# Patient Record
Sex: Female | Born: 1985 | Race: White | Hispanic: No | Marital: Married | Smoking: Never smoker
Health system: Southern US, Community
[De-identification: ages and names within clinical notes are randomized; demographics above are authoritative.]

## PROBLEM LIST (undated history)

## (undated) ENCOUNTER — Inpatient Hospital Stay (HOSPITAL_COMMUNITY): Payer: Self-pay

## (undated) DIAGNOSIS — Z87440 Personal history of urinary (tract) infections: Secondary | ICD-10-CM

## (undated) DIAGNOSIS — J45909 Unspecified asthma, uncomplicated: Secondary | ICD-10-CM

## (undated) DIAGNOSIS — G43909 Migraine, unspecified, not intractable, without status migrainosus: Secondary | ICD-10-CM

## (undated) DIAGNOSIS — B019 Varicella without complication: Secondary | ICD-10-CM

## (undated) HISTORY — DX: Migraine, unspecified, not intractable, without status migrainosus: G43.909

## (undated) HISTORY — PX: NO PAST SURGERIES: SHX2092

## (undated) HISTORY — DX: Personal history of urinary (tract) infections: Z87.440

## (undated) HISTORY — DX: Unspecified asthma, uncomplicated: J45.909

## (undated) HISTORY — DX: Varicella without complication: B01.9

---

## 2013-07-14 ENCOUNTER — Emergency Department: Payer: Self-pay | Admitting: Emergency Medicine

## 2013-07-14 LAB — URINALYSIS, COMPLETE
BILIRUBIN, UR: NEGATIVE
Blood: NEGATIVE
Glucose,UR: NEGATIVE mg/dL (ref 0–75)
Nitrite: NEGATIVE
PH: 5 (ref 4.5–8.0)
Protein: NEGATIVE
RBC,UR: 2 /HPF (ref 0–5)
Specific Gravity: 1.026 (ref 1.003–1.030)
WBC UR: 43 /HPF (ref 0–5)

## 2013-07-14 LAB — COMPREHENSIVE METABOLIC PANEL
ALK PHOS: 35 U/L — AB
AST: 14 U/L — AB (ref 15–37)
Albumin: 3.8 g/dL (ref 3.4–5.0)
Anion Gap: 8 (ref 7–16)
BUN: 14 mg/dL (ref 7–18)
Bilirubin,Total: 0.4 mg/dL (ref 0.2–1.0)
CALCIUM: 8.9 mg/dL (ref 8.5–10.1)
Chloride: 105 mmol/L (ref 98–107)
Co2: 26 mmol/L (ref 21–32)
Creatinine: 0.66 mg/dL (ref 0.60–1.30)
EGFR (Non-African Amer.): >60
GLUCOSE: 94 mg/dL (ref 65–99)
OSMOLALITY: 278 (ref 275–301)
Potassium: 3.4 mmol/L — ABNORMAL LOW (ref 3.5–5.1)
SGPT (ALT): 17 U/L (ref 12–78)
SODIUM: 139 mmol/L (ref 136–145)
Total Protein: 7 g/dL (ref 6.4–8.2)

## 2013-07-14 LAB — CBC WITH DIFFERENTIAL/PLATELET
BASOS ABS: 0.1 10*3/uL (ref 0.0–0.1)
Basophil %: 0.7 %
EOS ABS: 0.2 10*3/uL (ref 0.0–0.7)
Eosinophil %: 2.3 %
HCT: 40.3 % (ref 35.0–47.0)
HGB: 13.5 g/dL (ref 12.0–16.0)
LYMPHS PCT: 26.5 %
Lymphocyte #: 2 10*3/uL (ref 1.0–3.6)
MCH: 29.6 pg (ref 26.0–34.0)
MCHC: 33.5 g/dL (ref 32.0–36.0)
MCV: 88 fL (ref 80–100)
MONOS PCT: 5.8 %
Monocyte #: 0.4 x10 3/mm (ref 0.2–0.9)
NEUTROS ABS: 4.9 10*3/uL (ref 1.4–6.5)
Neutrophil %: 64.7 %
Platelet: 222 10*3/uL (ref 150–440)
RBC: 4.56 10*6/uL (ref 3.80–5.20)
RDW: 13.1 % (ref 11.5–14.5)
WBC: 7.5 10*3/uL (ref 3.6–11.0)

## 2013-07-14 LAB — HCG, QUANTITATIVE, PREGNANCY: BETA HCG, QUANT.: 111015 m[IU]/mL — AB

## 2013-07-14 LAB — LIPASE, BLOOD: Lipase: 190 U/L (ref 73–393)

## 2013-10-02 ENCOUNTER — Encounter: Payer: Self-pay | Admitting: Obstetrics and Gynecology

## 2013-10-21 ENCOUNTER — Encounter: Payer: Self-pay | Admitting: Pediatric Cardiology

## 2013-10-21 DIAGNOSIS — O358XX0 Maternal care for other (suspected) fetal abnormality and damage, not applicable or unspecified: Secondary | ICD-10-CM | POA: Insufficient documentation

## 2014-02-22 ENCOUNTER — Inpatient Hospital Stay: Payer: Self-pay

## 2014-02-22 LAB — CBC WITH DIFFERENTIAL/PLATELET
BASOS PCT: 0.4 %
Basophil #: 0 10*3/uL (ref 0.0–0.1)
EOS PCT: 1 %
Eosinophil #: 0.1 10*3/uL (ref 0.0–0.7)
HCT: 39.6 % (ref 35.0–47.0)
HGB: 13.2 g/dL (ref 12.0–16.0)
LYMPHS ABS: 2 10*3/uL (ref 1.0–3.6)
Lymphocyte %: 15.7 %
MCH: 28.4 pg (ref 26.0–34.0)
MCHC: 33.4 g/dL (ref 32.0–36.0)
MCV: 85 fL (ref 80–100)
Monocyte #: 0.8 x10 3/mm (ref 0.2–0.9)
Monocyte %: 6.1 %
NEUTROS PCT: 76.8 %
Neutrophil #: 9.7 10*3/uL — ABNORMAL HIGH (ref 1.4–6.5)
Platelet: 247 10*3/uL (ref 150–440)
RBC: 4.66 10*6/uL (ref 3.80–5.20)
RDW: 13.1 % (ref 11.5–14.5)
WBC: 12.7 10*3/uL — AB (ref 3.6–11.0)

## 2014-02-23 LAB — HEMATOCRIT: HCT: 35.4 % (ref 35.0–47.0)

## 2014-06-23 NOTE — H&P (Signed)
L&D Evaluation:  History:  HPI 29yo MWF presents in active labor at term, G1P0000 with [facility] 03/02/14, normal PNC to date   Presents with back pain, contractions   Patient's Medical History No Chronic Illness   Patient's Surgical History none   Medications Pre Natal Vitamins  Tylenol (Acetaminophen)   Allergies NKDA   Social History none   Family History Non-Contributory   ROS:  ROS All systems were reviewed.  HEENT, CNS, GI, GU, Respiratory, CV, Renal and Musculoskeletal systems were found to be normal.   Exam:  Vital Signs stable   General no apparent distress   Mental Status clear   Abdomen gravid, tender with contractions   Estimated Fetal Weight Average for gestational age   Fetal Position vtx   Back no CVAT   Edema no edema   Pelvic 90/90/0   Mebranes Ruptured, AROM   Description clear   FHT normal rate with no decels   Fetal Heart Rate 113   Ucx irregular   Ucx Frequency 3 min   Length of each Contraction 50 seconds   Ucx Pain Scale 0 as epidural in place   Skin dry   Impression:  Impression active labor   Plan:  Plan monitor contractions and for cervical change   Electronic Signatures: Evonnie Pat (CNM)  (Signed 10-Jan-16 08:14)  Authored: L&D Evaluation   Last Updated: 10-Jan-16 08:14 by Evonnie Pat (CNM)

## 2015-02-14 NOTE — L&D Delivery Note (Addendum)
  Bree, Rees [id number]  Delivery Note At 9:02 AM a viable and healthy female was delivered via Vaginal, Spontaneous Delivery (Presentation: LOA;  ).  APGAR: 8, 9; weight  pending.   Placenta status: spontaneous, intact.  Cord:  with the following complications: none.  Cord pH: na  Anesthesia:   Episiotomy: None Lacerations: None Suture Repair: na Est. Blood Loss (mL): 100  Mom to postpartum.  Baby to Couplet care / Skin to Skin.  Orlander Norwood J 01/08/2016, 9:39 AM     Lanasia, Pytlik [id number]  Delivery Note At 9:06 AM a viable and healthy female was delivered via Vaginal, Vacuum (Extractor). Kiwi x one pull. 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 Applied at +4. One pull , no pop offs. (Presentation: LOA  ).  APGAR: 8, 9; weight  pending.   Placenta status: spontanous, intact.  Cord:  with the following complications: occult cord prolapse.  Cord pH: na  Anesthesia:  epidural Episiotomy: None Lacerations: None Suture Repair: na Est. Blood Loss (mL): 100  Mom to na.  Baby to Couplet care / Skin to Skin.  Shaelee Forni J 01/08/2016, 9:39 AM  Dr. Pamala Hurry present for ultrasound guidance and delivery for 30 minutes.

## 2015-02-17 ENCOUNTER — Encounter: Payer: Self-pay | Admitting: Family Medicine

## 2015-02-17 ENCOUNTER — Ambulatory Visit (INDEPENDENT_AMBULATORY_CARE_PROVIDER_SITE_OTHER): Payer: Commercial Managed Care - HMO | Admitting: Family Medicine

## 2015-02-17 VITALS — BP 118/88 | HR 76 | Temp 98.0°F | Ht 64.5 in | Wt 116.0 lb

## 2015-02-17 DIAGNOSIS — J029 Acute pharyngitis, unspecified: Secondary | ICD-10-CM | POA: Insufficient documentation

## 2015-02-17 DIAGNOSIS — G43909 Migraine, unspecified, not intractable, without status migrainosus: Secondary | ICD-10-CM | POA: Insufficient documentation

## 2015-02-17 DIAGNOSIS — R55 Syncope and collapse: Secondary | ICD-10-CM | POA: Diagnosis not present

## 2015-02-17 DIAGNOSIS — G43709 Chronic migraine without aura, not intractable, without status migrainosus: Secondary | ICD-10-CM

## 2015-02-17 LAB — COMPREHENSIVE METABOLIC PANEL
ALBUMIN: 4.6 g/dL (ref 3.5–5.2)
ALT: 15 U/L (ref 0–35)
AST: 18 U/L (ref 0–37)
Alkaline Phosphatase: 48 U/L (ref 39–117)
BUN: 16 mg/dL (ref 6–23)
CO2: 30 mEq/L (ref 19–32)
CREATININE: 0.66 mg/dL (ref 0.40–1.20)
Calcium: 9.6 mg/dL (ref 8.4–10.5)
Chloride: 103 mEq/L (ref 96–112)
GFR: 113.91 mL/min (ref >60.00)
Glucose, Bld: 93 mg/dL (ref 70–99)
Potassium: 3.8 mEq/L (ref 3.5–5.1)
Sodium: 139 mEq/L (ref 135–145)
Total Bilirubin: 0.3 mg/dL (ref 0.2–1.2)
Total Protein: 7.5 g/dL (ref 6.0–8.3)

## 2015-02-17 LAB — POCT RAPID STREP A (OFFICE): RAPID STREP A SCREEN: NEGATIVE

## 2015-02-17 LAB — CBC
HCT: 43.7 % (ref 36.0–46.0)
Hemoglobin: 14.4 g/dL (ref 12.0–15.0)
MCHC: 32.9 g/dL (ref 30.0–36.0)
MCV: 89.3 fl (ref 78.0–100.0)
Platelets: 225 10*3/uL (ref 150.0–400.0)
RBC: 4.89 Mil/uL (ref 3.87–5.11)
RDW: 13.2 % (ref 11.5–15.5)
WBC: 9 10*3/uL (ref 4.0–10.5)

## 2015-02-17 LAB — TSH: TSH: 3.63 u[IU]/mL (ref 0.35–4.50)

## 2015-02-17 LAB — IRON AND TIBC
%SAT: 7 % — ABNORMAL LOW (ref 11–50)
IRON: 20 ug/dL — AB (ref 40–190)
TIBC: 295 ug/dL (ref 250–450)
UIBC: 275 ug/dL (ref 125–400)

## 2015-02-17 LAB — IRON: IRON: 25 ug/dL — AB (ref 42–145)

## 2015-02-17 LAB — POCT URINE PREGNANCY: PREG TEST UR: NEGATIVE

## 2015-02-17 LAB — FERRITIN: Ferritin: 50.2 ng/mL (ref 10.0–291.0)

## 2015-02-17 NOTE — Progress Notes (Signed)
Pre visit review using our clinic review tool, if applicable. No additional management support is needed unless otherwise documented below in the visit note. 

## 2015-02-17 NOTE — Progress Notes (Addendum)
Subjective:  Patient ID: Latasha Smith, female    DOB: [birth year]  Age: 30 y.o. MRN: [id number]  CC: Establish care; Sore throat, Dizziness/lightheadedness, feeling that she is going to pass out  HPI Latasha Smith is a 30 y.o. female presents to the clinic today with the following concerns/issues.  Sore throat  Began 2 days ago.  She reports associated low-grade temperature, 100.2.  No known sick contacts.  She's been taking ibuprofen with some relief.  No known exacerbating factors.  Migraine  Patient reports that she has a history of migraine headache.  She states that for the past month she been experiencing these headaches more frequently.  She states that they occur approximately once a day.  She states the pain is located behind the eyes.  She states that she takes ibuprofen with improvement.  She is concerned given the increase in frequency.  No known inciting or exacerbating factors.  Dizziness/lightheadedness  Patient reports that for the past month she's been experiencing dizziness/lightheadedness.  She states that it occurs with certain movements, especially when rising from bending.  She states it also occurs when she lifts objects.  Additionally she states that it has happened with exercise.  She describes it as feeling that she cannot pass out. She states that her vision gets blurry and goes dark.  She denies any associated chest pain or shortness of breath. She denies vertigo.  She does report associated weakness and fatigue. Additionally, she states that she's had some nausea.  No reports of palpitations. She has not actually passed out.  He states that her symptoms last from minutes up to 1 hour.  Improves with rest.  PMH, Surgical Hx, Family Hx, Social History reviewed and updated as below.  Past Medical History  Diagnosis Date  . Asthma     As a child  . Chicken pox   . Migraine   . History of UTI    Past  Surgical History  Procedure Laterality Date  . No past surgeries     Family History  Problem Relation Age of Onset  . Alcoholism Paternal Grandfather   . Arthritis Maternal Grandmother   . Lung cancer Paternal Grandfather   . Heart disease Maternal Grandfather   . Stroke Paternal Grandmother   . Hypertension Maternal Grandfather   . Diabetes Maternal Grandfather    Social History  Substance Use Topics  . Smoking status: Never Smoker   . Smokeless tobacco: Never Used  . Alcohol Use: No    Review of Systems  HENT: Positive for sore throat and voice change.   Eyes: Positive for visual disturbance.  Respiratory: Positive for cough.   Gastrointestinal: Positive for nausea.  Neurological: Positive for dizziness, weakness, light-headedness and headaches.  Psychiatric/Behavioral:       Anxiety.  All other systems reviewed and are negative.   Objective:   Today's Vitals: BP 118/88 mmHg  Pulse 76  Temp(Src) 98 F (36.7 C) (Oral)  Ht 5' 4.5" (1.638 m)  Wt 116 lb (52.617 kg)  BMI 19.61 kg/m2  SpO2 98%  LMP 01/17/2015  Physical Exam  Constitutional: She is oriented to person, place, and time. She appears well-developed. No distress.  HENT:  Head: Normocephalic and atraumatic.  Oropharynx clear. TMs normal bilaterally.   Eyes: Conjunctivae are normal. No scleral icterus.  Neck: Neck supple.  Cardiovascular: Normal rate and regular rhythm.   2/6 systolic murmur noted.  Pulmonary/Chest: Effort normal and breath sounds normal. No respiratory distress. She has  no wheezes. She has no rales.  Abdominal: Soft. She exhibits no distension. There is no tenderness. There is no rebound and no guarding.  Musculoskeletal: Normal range of motion. She exhibits no edema.  Lymphadenopathy:    She has no cervical adenopathy.  Neurological: She is alert and oriented to person, place, and time.  No focal deficits.  Skin: Skin is warm and dry.  Psychiatric: She has a normal mood and  affect.  Vitals reviewed.  Assessment & Plan:   Problem List Items Addressed This Visit    Sore throat - Primary    Rapid strep negative today. Sending for culture. No indication for treatment at this time. Continue ibuprofen as needed for pain.      Relevant Orders   POCT rapid strep A (Completed)   Culture, Group A Strep   Pre-syncope    Patient's history consistent with presyncope. Orthostatics positive today with a 20 mm Hg drop in systolic blood pressure from lying to standing. Given history of heavy menses and anemia (during pregnancy and postpartum), CBC and iron panel were obtained. CBC normal. Iron Panel was significant for iron deficiency. Will treat with iron supplementation and prenatal. Patient does not have any associated chest pain or shortness of breath. The etiology of her presyncope is unclear to me at this time. Will discuss with patient and likely refer to cardiology for further evaluation with echo, potential Holter monitor, and potential tilt table test.        Relevant Orders   CBC (Completed)   Comp Met (CMET) (Completed)   TSH (Completed)   Ferritin (Completed)   Iron (Completed)   Iron Binding Cap (TIBC) (Completed)   POCT urine pregnancy (Completed)   Migraine    Chronic. Advised continued intermittent use of NSAIDs. If continues to be persistent will consider prophylaxis.        Follow-up: Follow up to be scheduled following work up.  [place]

## 2015-02-17 NOTE — Assessment & Plan Note (Signed)
Chronic. Advised continued intermittent use of NSAIDs. If continues to be persistent will consider prophylaxis.

## 2015-02-17 NOTE — Assessment & Plan Note (Signed)
Rapid strep negative today. Sending for culture. No indication for treatment at this time. Continue ibuprofen as needed for pain.

## 2015-02-17 NOTE — Patient Instructions (Signed)
It was nice to see you today.  Continue the ibuprofen for your sore throat.  If you migraines continue, we can consider prophylactic treatment (to prevent them).  We will call with your lab results.  In the mean time, take it easy.  Take care. Follow up to be arranged following your labs.  Dr

## 2015-02-17 NOTE — Assessment & Plan Note (Addendum)
Patient's history consistent with presyncope. Orthostatics positive today with a 20 mm Hg drop in systolic blood pressure from lying to standing. Given history of heavy menses and anemia (during pregnancy and postpartum), CBC and iron panel were obtained. CBC normal. Iron Panel was significant for iron deficiency. Will treat with iron supplementation and prenatal. Patient does not have any associated chest pain or shortness of breath. The etiology of her presyncope is unclear to me at this time. Will discuss with patient and likely refer to cardiology for further evaluation with echo, potential Holter monitor, and potential tilt table test.

## 2015-02-18 ENCOUNTER — Other Ambulatory Visit: Payer: Self-pay | Admitting: Family Medicine

## 2015-02-18 ENCOUNTER — Telehealth: Payer: Self-pay | Admitting: *Deleted

## 2015-02-18 MED ORDER — FERROUS SULFATE 325 (65 FE) MG PO TABS: 325.0000 mg | ORAL_TABLET | Freq: Two times a day (BID) | ORAL | Status: DC

## 2015-02-18 NOTE — Telephone Encounter (Signed)
Patient missed a call, she state that it could of been about her lab results from 02/17/15.

## 2015-02-18 NOTE — Telephone Encounter (Signed)
Tried to return call to patient no answer left voicemail.

## 2015-02-19 ENCOUNTER — Other Ambulatory Visit: Payer: Self-pay | Admitting: Family Medicine

## 2015-02-19 DIAGNOSIS — R55 Syncope and collapse: Secondary | ICD-10-CM

## 2015-02-19 LAB — CULTURE, GROUP A STREP: Organism ID, Bacteria: NORMAL

## 2015-02-19 NOTE — Telephone Encounter (Signed)
Patient stated that she will be at telephone [phone] for today, to receive test results.

## 2015-02-19 NOTE — Telephone Encounter (Signed)
Spoke with patient, reviewed labs will follow up with cardiology as requested.  Would like referral to cardiology in [place].  Thanks

## 2015-02-23 ENCOUNTER — Ambulatory Visit: Payer: Self-pay | Admitting: Primary Care

## 2015-03-01 ENCOUNTER — Encounter: Payer: Self-pay | Admitting: *Deleted

## 2015-03-08 ENCOUNTER — Ambulatory Visit (INDEPENDENT_AMBULATORY_CARE_PROVIDER_SITE_OTHER): Payer: Commercial Managed Care - HMO | Admitting: Primary Care

## 2015-03-08 ENCOUNTER — Encounter: Payer: Self-pay | Admitting: Primary Care

## 2015-03-08 VITALS — BP 116/70 | HR 81 | Temp 98.1°F | Ht 65.0 in | Wt 123.0 lb

## 2015-03-08 DIAGNOSIS — R21 Rash and other nonspecific skin eruption: Secondary | ICD-10-CM

## 2015-03-08 MED ORDER — TRIAMCINOLONE ACETONIDE 0.1 % EX CREA: 1.0000 "application " | TOPICAL_CREAM | Freq: Two times a day (BID) | CUTANEOUS | Status: DC

## 2015-03-08 NOTE — Progress Notes (Signed)
   Subjective:    Patient ID: Latasha Smith, female    DOB: [birth year], 30 y.o.   MRN: [id number]  HPI  Latasha Smith is a 30 year old female who presents today with a chief complaint of rash. She first noticed the rash 1 month ago.  Her rash is located to her knees and bilateral axilla. Over the past week she's noticed increased itching and feels as though the rash is enlarging. She's applied OTC cortisone cream without improvement. Denies closure of throat, wheezing, changes in soaps/detergents/food/daily routine. Endorses a history of eczema.    Review of Systems  Constitutional: Negative for fever and chills.  HENT: Negative for trouble swallowing.   Skin: Positive for rash.       Past Medical History  Diagnosis Date  . Asthma     As a child  . Chicken pox   . Migraine   . History of UTI     Social History   Social History  . Marital Status: Married    Spouse Name: N/A  . Number of Children: N/A  . Years of Education: N/A   Occupational History  . Not on file.   Social History Main Topics  . Smoking status: Never Smoker   . Smokeless tobacco: Never Used  . Alcohol Use: No  . Drug Use: No  . Sexual Activity: Yes    Birth Control/ Protection: Condom   Other Topics Concern  . Not on file   Social History Narrative    Past Surgical History  Procedure Laterality Date  . No past surgeries      Family History  Problem Relation Age of Onset  . Alcoholism Paternal Grandfather   . Arthritis Maternal Grandmother   . Lung cancer Paternal Grandfather   . Heart disease Maternal Grandfather   . Stroke Paternal Grandmother   . Hypertension Maternal Grandfather   . Diabetes Maternal Grandfather     No Known Allergies  Current Outpatient Prescriptions on File Prior to Visit  Medication Sig Dispense Refill  . ferrous sulfate 325 (65 FE) MG tablet Take 1 tablet (325 mg total) by mouth 2 (two) times daily. 60 tablet 2   No current facility-administered  medications on file prior to visit.    BP 116/70 mmHg  Pulse 81  Temp(Src) 98.1 F (36.7 C) (Oral)  Ht 5\' 5"  (1.651 m)  Wt 123 lb (55.792 kg)  BMI 20.47 kg/m2  SpO2 98%  LMP 02/15/2015    Objective:   Physical Exam  Constitutional: She appears well-nourished.  Cardiovascular: Normal rate and regular rhythm.   Pulmonary/Chest: Effort normal and breath sounds normal.  Skin:  Dry, whitish, rash to bilateral knees characteristic of eczema.          Assessment & Plan:  Rash:  Located to bilateral patella x 1 month, worse over last week. Dry, whitish, rash representative of eczema. Does not appear to be contact dermatitis. No changes in soaps or daily routine. No improvement in itching or size with OTC hydrocortisone. RX for Triamcinolone 0.1% sent to pharmacy for BID application. Instructed to wash hands after use and not to apply to face. She is to follow up with PCP if no improvement.

## 2015-03-08 NOTE — Patient Instructions (Signed)
Start Triamcinolone cream. Apply twice daily to affected areas for 7 days. Please notify myself or PCP if no improvement in rash.  It was a pleasure meeting you!

## 2015-03-08 NOTE — Progress Notes (Signed)
Pre visit review using our clinic review tool, if applicable. No additional management support is needed unless otherwise documented below in the visit note. 

## 2015-06-23 ENCOUNTER — Encounter (HOSPITAL_COMMUNITY): Payer: Self-pay | Admitting: *Deleted

## 2015-06-23 ENCOUNTER — Inpatient Hospital Stay (HOSPITAL_COMMUNITY)
Admission: AD | Admit: 2015-06-23 | Discharge: 2015-06-23 | Disposition: A | Discharge status: Home / Self Care | Payer: Commercial Managed Care - HMO | Source: Ambulatory Visit | Attending: Obstetrics and Gynecology | Admitting: Obstetrics and Gynecology

## 2015-06-23 DIAGNOSIS — O219 Vomiting of pregnancy, unspecified: Secondary | ICD-10-CM

## 2015-06-23 DIAGNOSIS — Z3A01 Less than 8 weeks gestation of pregnancy: Secondary | ICD-10-CM | POA: Diagnosis not present

## 2015-06-23 DIAGNOSIS — O99611 Diseases of the digestive system complicating pregnancy, first trimester: Secondary | ICD-10-CM | POA: Insufficient documentation

## 2015-06-23 DIAGNOSIS — O210 Mild hyperemesis gravidarum: Secondary | ICD-10-CM | POA: Insufficient documentation

## 2015-06-23 DIAGNOSIS — K117 Disturbances of salivary secretion: Secondary | ICD-10-CM | POA: Diagnosis not present

## 2015-06-23 LAB — URINE MICROSCOPIC-ADD ON

## 2015-06-23 LAB — URINALYSIS, ROUTINE W REFLEX MICROSCOPIC
Bilirubin Urine: NEGATIVE
Glucose, UA: NEGATIVE mg/dL
HGB URINE DIPSTICK: NEGATIVE
Ketones, ur: NEGATIVE mg/dL
NITRITE: NEGATIVE
PROTEIN: NEGATIVE mg/dL
SPECIFIC GRAVITY, URINE: 1.025 (ref 1.005–1.030)
pH: 6 (ref 5.0–8.0)

## 2015-06-23 LAB — POCT PREGNANCY, URINE: PREG TEST UR: POSITIVE — AB

## 2015-06-23 MED ORDER — FAMOTIDINE IN NACL 20-0.9 MG/50ML-% IV SOLN
20.0000 mg | Freq: Once | INTRAVENOUS | Status: AC
  Administered 2015-06-23: 20 mg via INTRAVENOUS
  Filled 2015-06-23: qty 50

## 2015-06-23 MED ORDER — PROMETHAZINE HCL 12.5 MG RE SUPP: 12.5000 mg | Freq: Four times a day (QID) | RECTAL | Status: DC | PRN

## 2015-06-23 MED ORDER — PROMETHAZINE HCL 25 MG/ML IJ SOLN
25.0000 mg | Freq: Once | INTRAVENOUS | Status: AC
  Administered 2015-06-23: 25 mg via INTRAVENOUS
  Filled 2015-06-23: qty 1

## 2015-06-23 MED ORDER — RANITIDINE HCL 150 MG PO CAPS: 150.0000 mg | ORAL_CAPSULE | Freq: Every day | ORAL | Status: DC

## 2015-06-23 MED ORDER — DOXYLAMINE-PYRIDOXINE 10-10 MG PO TBEC: 2.0000 | DELAYED_RELEASE_TABLET | Freq: Every day | ORAL | Status: DC

## 2015-06-23 NOTE — MAU Note (Signed)
Pt reports she is [redacted] weeks pregnant with twins, states she has been unable to keep any liquids or solids down for the last 3 days.

## 2015-06-23 NOTE — MAU Provider Note (Signed)
History     CSN: [id number]  Arrival date and time: 06/23/15 [id number]   First Provider Initiated Contact with Patient 06/23/15 2045      Chief Complaint  Patient presents with  . Nausea  . Dehydration  . Emesis   HPI   Latasha Smith is a 30 y.o. female G2P1001 @ [redacted]w[redacted]d presents to MAU with N/V.  Patient is scheduled with [facility], however has not been seen by their practice.   Not taking anything currently, wants to take something however is unable to keep anything down.  Has not actually vomited today, however every time she puts fluids in her mouth it comes back up.   + spitting.   OB History    Gravida Para Term Preterm AB TAB SAB Ectopic Multiple Living   2 1 1  0 0 0 0 0 0 1      Past Medical History  Diagnosis Date  . Asthma     As a child  . Chicken pox   . Migraine   . History of UTI     Past Surgical History  Procedure Laterality Date  . No past surgeries      Family History  Problem Relation Age of Onset  . Alcoholism Paternal Grandfather   . Arthritis Maternal Grandmother   . Lung cancer Paternal Grandfather   . Heart disease Maternal Grandfather   . Stroke Paternal Grandmother   . Hypertension Maternal Grandfather   . Diabetes Maternal Grandfather     Social History  Substance Use Topics  . Smoking status: Never Smoker   . Smokeless tobacco: Never Used  . Alcohol Use: No    Allergies: No Known Allergies  Prescriptions prior to admission  Medication Sig Dispense Refill Last Dose  . ferrous sulfate 325 (65 FE) MG tablet Take 1 tablet (325 mg total) by mouth 2 (two) times daily. (Patient not taking: Reported on 06/23/2015) 60 tablet 2 Taking  . triamcinolone cream (KENALOG) 0.1 % Apply 1 application topically 2 (two) times daily. (Patient not taking: Reported on 06/23/2015) 30 g 0    Results for orders placed or performed during the hospital encounter of 06/23/15 (from the past 48 hour(s))  Urinalysis, Routine w reflex microscopic  (not at [facility])     Status: Abnormal   Collection Time: 06/23/15  8:17 PM  Result Value Ref Range   Color, Urine YELLOW YELLOW   APPearance CLEAR CLEAR   Specific Gravity, Urine 1.025 1.005 - 1.030   pH 6.0 5.0 - 8.0   Glucose, UA NEGATIVE NEGATIVE mg/dL   Hgb urine dipstick NEGATIVE NEGATIVE   Bilirubin Urine NEGATIVE NEGATIVE   Ketones, ur NEGATIVE NEGATIVE mg/dL   Protein, ur NEGATIVE NEGATIVE mg/dL   Nitrite NEGATIVE NEGATIVE   Leukocytes, UA TRACE (A) NEGATIVE  Urine microscopic-add on     Status: Abnormal   Collection Time: 06/23/15  8:17 PM  Result Value Ref Range   Squamous Epithelial / LPF 0-5 (A) NONE SEEN   WBC, UA 0-5 0 - 5 WBC/hpf   RBC / HPF 0-5 0 - 5 RBC/hpf   Bacteria, UA FEW (A) NONE SEEN  Pregnancy, urine POC     Status: Abnormal   Collection Time: 06/23/15  8:42 PM  Result Value Ref Range   Preg Test, Ur POSITIVE (A) NEGATIVE    Comment:        THE SENSITIVITY OF THIS METHODOLOGY IS >24 mIU/mL     Review of Systems  Constitutional: Negative for  fever and chills.  Gastrointestinal: Positive for heartburn, nausea and vomiting.  Genitourinary: Negative for dysuria, urgency and frequency.  Neurological: Positive for headaches.   Physical Exam   Blood pressure 105/70, pulse 75, temperature 97.8 F (36.6 C), temperature source Oral, resp. rate 18, height 5\' 4"  (1.626 m), weight 117 lb (53.071 kg), last menstrual period 04/29/2015, SpO2 100 %.  Physical Exam  Constitutional: She is oriented to person, place, and time. She appears well-developed and well-nourished. No distress.  HENT:  Head: Normocephalic.  GI: Soft. She exhibits no distension. There is no tenderness.  Musculoskeletal: Normal range of motion.  Neurological: She is alert and oriented to person, place, and time.  Skin: She is not diaphoretic.    MAU Course  Procedures  None  MDM  25 mg of Phenergan in LR bolus Pepcid Patient tolerating PO fluids   Assessment and Plan    A:  1.  Nausea and vomiting during pregnancy   2. Ptyalism     P:  Discharge home in stable condition Follow up with [facility] as scheduled RX: Zantac        Phenergan suppository- patient worried about swallowing a pill Small, frequent meals   Lezlie Lye, NP 06/24/2015 6:03 PM

## 2015-06-23 NOTE — Discharge Instructions (Signed)

## 2015-07-05 LAB — OB RESULTS CONSOLE GC/CHLAMYDIA
CHLAMYDIA, DNA PROBE: NEGATIVE
GC PROBE AMP, GENITAL: NEGATIVE

## 2015-07-05 LAB — OB RESULTS CONSOLE HIV ANTIBODY (ROUTINE TESTING): HIV: NONREACTIVE

## 2015-07-05 LAB — OB RESULTS CONSOLE ABO/RH: RH TYPE: POSITIVE

## 2015-07-05 LAB — OB RESULTS CONSOLE ANTIBODY SCREEN: Antibody Screen: NEGATIVE

## 2015-07-05 LAB — OB RESULTS CONSOLE HEPATITIS B SURFACE ANTIGEN: Hepatitis B Surface Ag: NEGATIVE

## 2015-07-05 LAB — OB RESULTS CONSOLE RPR: RPR: NONREACTIVE

## 2015-07-05 LAB — OB RESULTS CONSOLE RUBELLA ANTIBODY, IGM: Rubella: IMMUNE

## 2015-07-09 ENCOUNTER — Other Ambulatory Visit: Payer: Self-pay | Admitting: Obstetrics and Gynecology

## 2015-12-30 ENCOUNTER — Other Ambulatory Visit: Payer: Self-pay | Admitting: Obstetrics and Gynecology

## 2016-01-08 ENCOUNTER — Inpatient Hospital Stay (HOSPITAL_COMMUNITY): Payer: Commercial Managed Care - HMO | Admitting: Anesthesiology

## 2016-01-08 ENCOUNTER — Inpatient Hospital Stay (HOSPITAL_COMMUNITY)
Admission: AD | Admit: 2016-01-08 | Discharge: 2016-01-10 | DRG: 775 | Disposition: A | Discharge status: Home / Self Care | Payer: Commercial Managed Care - HMO | Source: Ambulatory Visit | Attending: Obstetrics and Gynecology | Admitting: Obstetrics and Gynecology

## 2016-01-08 ENCOUNTER — Encounter (HOSPITAL_COMMUNITY): Payer: Self-pay

## 2016-01-08 ENCOUNTER — Inpatient Hospital Stay (HOSPITAL_COMMUNITY): Payer: Commercial Managed Care - HMO

## 2016-01-08 DIAGNOSIS — Z823 Family history of stroke: Secondary | ICD-10-CM

## 2016-01-08 DIAGNOSIS — Z833 Family history of diabetes mellitus: Secondary | ICD-10-CM | POA: Diagnosis not present

## 2016-01-08 DIAGNOSIS — Z8249 Family history of ischemic heart disease and other diseases of the circulatory system: Secondary | ICD-10-CM | POA: Diagnosis not present

## 2016-01-08 DIAGNOSIS — Z3493 Encounter for supervision of normal pregnancy, unspecified, third trimester: Secondary | ICD-10-CM | POA: Diagnosis present

## 2016-01-08 DIAGNOSIS — O42913 Preterm premature rupture of membranes, unspecified as to length of time between rupture and onset of labor, third trimester: Secondary | ICD-10-CM

## 2016-01-08 DIAGNOSIS — Z3689 Encounter for other specified antenatal screening: Secondary | ICD-10-CM

## 2016-01-08 DIAGNOSIS — Z3A36 36 weeks gestation of pregnancy: Secondary | ICD-10-CM

## 2016-01-08 DIAGNOSIS — O30033 Twin pregnancy, monochorionic/diamniotic, third trimester: Secondary | ICD-10-CM | POA: Diagnosis not present

## 2016-01-08 LAB — CBC
HCT: 33.4 % — ABNORMAL LOW (ref 36.0–46.0)
Hemoglobin: 11.3 g/dL — ABNORMAL LOW (ref 12.0–15.0)
MCH: 27.6 pg (ref 26.0–34.0)
MCHC: 33.8 g/dL (ref 30.0–36.0)
MCV: 81.5 fL (ref 78.0–100.0)
PLATELETS: 239 10*3/uL (ref 150–400)
RBC: 4.1 MIL/uL (ref 3.87–5.11)
RDW: 13.7 % (ref 11.5–15.5)
WBC: 8.6 10*3/uL (ref 4.0–10.5)

## 2016-01-08 LAB — POCT FERN TEST: POCT FERN TEST: POSITIVE

## 2016-01-08 LAB — ABO/RH: ABO/RH(D): O POS

## 2016-01-08 LAB — RPR: RPR Ser Ql: NONREACTIVE

## 2016-01-08 LAB — TYPE AND SCREEN
ABO/RH(D): O POS
ANTIBODY SCREEN: NEGATIVE

## 2016-01-08 MED ORDER — EPHEDRINE 5 MG/ML INJ
10.0000 mg | INTRAVENOUS | Status: DC | PRN
  Filled 2016-01-08: qty 4

## 2016-01-08 MED ORDER — OXYCODONE-ACETAMINOPHEN 5-325 MG PO TABS: 2.0000 | ORAL_TABLET | ORAL | Status: DC | PRN

## 2016-01-08 MED ORDER — OXYTOCIN BOLUS FROM INFUSION
500.0000 mL | Freq: Once | INTRAVENOUS | Status: AC
  Administered 2016-01-08: 500 mL via INTRAVENOUS

## 2016-01-08 MED ORDER — DIPHENHYDRAMINE HCL 50 MG/ML IJ SOLN: 12.5000 mg | INTRAMUSCULAR | Status: DC | PRN

## 2016-01-08 MED ORDER — ACETAMINOPHEN 325 MG PO TABS: 650.0000 mg | ORAL_TABLET | ORAL | Status: DC | PRN

## 2016-01-08 MED ORDER — IBUPROFEN 600 MG PO TABS
600.0000 mg | ORAL_TABLET | Freq: Four times a day (QID) | ORAL | Status: DC
  Administered 2016-01-08 – 2016-01-10 (×8): 600 mg via ORAL
  Filled 2016-01-08 (×8): qty 1

## 2016-01-08 MED ORDER — ONDANSETRON HCL 4 MG/2ML IJ SOLN: 4.0000 mg | INTRAMUSCULAR | Status: DC | PRN

## 2016-01-08 MED ORDER — OXYCODONE-ACETAMINOPHEN 5-325 MG PO TABS: 1.0000 | ORAL_TABLET | ORAL | Status: DC | PRN

## 2016-01-08 MED ORDER — ACETAMINOPHEN 325 MG PO TABS
650.0000 mg | ORAL_TABLET | ORAL | Status: DC | PRN
Start: 2016-01-08 — End: 2016-01-10

## 2016-01-08 MED ORDER — LACTATED RINGERS IV SOLN
INTRAVENOUS | Status: DC
  Administered 2016-01-08 (×2): via INTRAVENOUS

## 2016-01-08 MED ORDER — FENTANYL 2.5 MCG/ML BUPIVACAINE 1/10 % EPIDURAL INFUSION (WH - ANES)
14.0000 mL/h | INTRAMUSCULAR | Status: DC | PRN
  Administered 2016-01-08: 14 mL/h via EPIDURAL
  Filled 2016-01-08: qty 100

## 2016-01-08 MED ORDER — LIDOCAINE HCL (PF) 1 % IJ SOLN
INTRAMUSCULAR | Status: DC | PRN
  Administered 2016-01-08: 8 mL via EPIDURAL
  Administered 2016-01-08: 6 mL via EPIDURAL

## 2016-01-08 MED ORDER — ONDANSETRON HCL 4 MG/2ML IJ SOLN: 4.0000 mg | Freq: Four times a day (QID) | INTRAMUSCULAR | Status: DC | PRN

## 2016-01-08 MED ORDER — OXYTOCIN 40 UNITS IN LACTATED RINGERS INFUSION - SIMPLE MED
2.5000 [IU]/h | INTRAVENOUS | Status: DC
  Administered 2016-01-08: 2.5 [IU]/h via INTRAVENOUS
  Filled 2016-01-08: qty 1000

## 2016-01-08 MED ORDER — ZOLPIDEM TARTRATE 5 MG PO TABS: 5.0000 mg | ORAL_TABLET | Freq: Every evening | ORAL | Status: DC | PRN

## 2016-01-08 MED ORDER — DIPHENHYDRAMINE HCL 25 MG PO CAPS: 25.0000 mg | ORAL_CAPSULE | Freq: Four times a day (QID) | ORAL | Status: DC | PRN

## 2016-01-08 MED ORDER — ONDANSETRON HCL 4 MG PO TABS: 4.0000 mg | ORAL_TABLET | ORAL | Status: DC | PRN

## 2016-01-08 MED ORDER — PRENATAL MULTIVITAMIN CH
1.0000 | ORAL_TABLET | Freq: Every day | ORAL | Status: DC
  Administered 2016-01-09 – 2016-01-10 (×2): 1 via ORAL
  Filled 2016-01-08 (×2): qty 1

## 2016-01-08 MED ORDER — LACTATED RINGERS IV SOLN
500.0000 mL | INTRAVENOUS | Status: DC | PRN
  Administered 2016-01-08: 1000 mL via INTRAVENOUS

## 2016-01-08 MED ORDER — PHENYLEPHRINE 40 MCG/ML (10ML) SYRINGE FOR IV PUSH (FOR BLOOD PRESSURE SUPPORT)
80.0000 ug | PREFILLED_SYRINGE | INTRAVENOUS | Status: DC | PRN
  Filled 2016-01-08: qty 5

## 2016-01-08 MED ORDER — SENNOSIDES-DOCUSATE SODIUM 8.6-50 MG PO TABS
2.0000 | ORAL_TABLET | ORAL | Status: DC
  Administered 2016-01-08 – 2016-01-09 (×2): 2 via ORAL
  Filled 2016-01-08 (×2): qty 2

## 2016-01-08 MED ORDER — LACTATED RINGERS IV SOLN
500.0000 mL | Freq: Once | INTRAVENOUS | Status: AC
  Administered 2016-01-08: 500 mL via INTRAVENOUS

## 2016-01-08 MED ORDER — WITCH HAZEL-GLYCERIN EX PADS: 1.0000 "application " | MEDICATED_PAD | CUTANEOUS | Status: DC | PRN

## 2016-01-08 MED ORDER — SIMETHICONE 80 MG PO CHEW: 80.0000 mg | CHEWABLE_TABLET | ORAL | Status: DC | PRN

## 2016-01-08 MED ORDER — BENZOCAINE-MENTHOL 20-0.5 % EX AERO: 1.0000 "application " | INHALATION_SPRAY | CUTANEOUS | Status: DC | PRN

## 2016-01-08 MED ORDER — OXYTOCIN 40 UNITS IN LACTATED RINGERS INFUSION - SIMPLE MED
1.0000 m[IU]/min | INTRAVENOUS | Status: DC
  Administered 2016-01-08: 2 m[IU]/min via INTRAVENOUS

## 2016-01-08 MED ORDER — COCONUT OIL OIL: 1.0000 "application " | TOPICAL_OIL | Status: DC | PRN

## 2016-01-08 MED ORDER — LIDOCAINE HCL (PF) 1 % IJ SOLN
30.0000 mL | INTRAMUSCULAR | Status: DC | PRN
  Filled 2016-01-08: qty 30

## 2016-01-08 MED ORDER — METHYLERGONOVINE MALEATE 0.2 MG/ML IJ SOLN: 0.2000 mg | INTRAMUSCULAR | Status: DC | PRN

## 2016-01-08 MED ORDER — TERBUTALINE SULFATE 1 MG/ML IJ SOLN
0.2500 mg | Freq: Once | INTRAMUSCULAR | Status: DC | PRN
  Filled 2016-01-08: qty 1

## 2016-01-08 MED ORDER — FLEET ENEMA 7-19 GM/118ML RE ENEM: 1.0000 | ENEMA | RECTAL | Status: DC | PRN

## 2016-01-08 MED ORDER — METHYLERGONOVINE MALEATE 0.2 MG PO TABS: 0.2000 mg | ORAL_TABLET | ORAL | Status: DC | PRN

## 2016-01-08 MED ORDER — TETANUS-DIPHTH-ACELL PERTUSSIS 5-2.5-18.5 LF-MCG/0.5 IM SUSP: 0.5000 mL | Freq: Once | INTRAMUSCULAR | Status: DC

## 2016-01-08 MED ORDER — DIBUCAINE 1 % RE OINT: 1.0000 "application " | TOPICAL_OINTMENT | RECTAL | Status: DC | PRN

## 2016-01-08 MED ORDER — SOD CITRATE-CITRIC ACID 500-334 MG/5ML PO SOLN: 30.0000 mL | ORAL | Status: DC | PRN

## 2016-01-08 NOTE — Anesthesia Procedure Notes (Signed)
Epidural Patient location during procedure: OB Start time: 01/08/2016 7:58 AM End time: 01/08/2016 8:02 AM  Staffing Anesthesiologist: Lyn Hollingshead Performed: anesthesiologist   Preanesthetic Checklist Completed: patient identified, surgical consent, pre-op evaluation, timeout performed, IV checked, risks and benefits discussed and monitors and equipment checked  Epidural Patient position: sitting Prep: site prepped and draped and DuraPrep Patient monitoring: continuous pulse ox and blood pressure Approach: midline Location: L3-L4 Injection technique: LOR air  Needle:  Needle type: Tuohy  Needle gauge: 17 G Needle length: 9 cm and 9 Needle insertion depth: 5 cm cm Catheter type: closed end flexible Catheter size: 19 Gauge Catheter at skin depth: 10 cm Test dose: negative and Other  Assessment Sensory level: T9 Events: blood not aspirated, injection not painful, no injection resistance, negative IV test and no paresthesia  Additional Notes Reason for block:procedure for pain

## 2016-01-08 NOTE — MAU Note (Signed)
Leaking clear fluid at 10:30 pm.  Baby moving well. No bleeding. Contractions none today.  2 cm in office yesterday.

## 2016-01-08 NOTE — Lactation Note (Signed)
This note was copied from a baby's chart. Lactation Consultation Note Baby "B" hasn't had interest in BF. Put baby in football hold and assisted in latching. Latched well. Sleepy. STS w/mom, hand expression easy flow of colostrum into baby's mouth at breast to stimulate to suckle. Wouldn't suckle at all. Would open mouth for wide latch but not BF.  Gave mom a bullet to hand express colostrum and curve tip syring to give to baby, then supplement w/formula Alimentum.  Reported to RN of [facility] consult and give formula to parents to supplement. FOB hand expressing mom in bullet. Encouraged mom STS as much as possible. Left baby STS w/mom.  See noted of baby "A" for interventions. Patient Name: Latasha Smith [id number] Date: 01/08/2016 Reason for consult: Initial assessment;Infant < 6lbs;Late preterm infant;Multiple gestation   Maternal Data Has patient been taught Hand Expression?: Yes Does the patient have breastfeeding experience prior to this delivery?: Yes  Feeding Feeding Type: Breast Fed Length of feed: 0 min  LATCH Score/Interventions Latch: Too sleepy or reluctant, no latch achieved, no sucking elicited. Intervention(s): Skin to skin;Teach feeding cues;Waking techniques Intervention(s): Adjust position;Assist with latch;Breast compression;Breast massage  Audible Swallowing: None Intervention(s): Skin to skin;Hand expression  Type of Nipple: Everted at rest and after stimulation  Comfort (Breast/Nipple): Soft / non-tender     Hold (Positioning): Assistance needed to correctly position infant at breast and maintain latch. Intervention(s): Breastfeeding basics reviewed;Support Pillows;Position options;Skin to skin  LATCH Score: 5  Lactation Tools Discussed/Used Tools: Pump Pump Review: Setup, frequency, and cleaning;Milk Storage Initiated by:: Allayne Stack RN IBCLC Date initiated:: 01/08/16   Consult Status Consult Status: Follow-up Date: 01/09/16 Follow-up type:  In-patient    Theodoro Kalata 01/08/2016, 4:26 PM

## 2016-01-08 NOTE — H&P (Signed)
Latasha Smith is a 30 y.o. female presenting for SROM.  OB History    Gravida Para Term Preterm AB Living   2 1 1  0 0 1   SAB TAB Ectopic Multiple Live Births   0 0 0 0       Past Medical History:  Diagnosis Date  . Asthma    As a child  . Chicken pox   . History of UTI   . Migraine    Past Surgical History:  Procedure Laterality Date  . NO PAST SURGERIES     Family History: family history includes Alcoholism in her paternal grandfather; Arthritis in her maternal grandmother; Diabetes in her maternal grandfather; Heart disease in her maternal grandfather; Hypertension in her maternal grandfather; Lung cancer in her paternal grandfather; Stroke in her paternal grandmother. Social History:  reports that she has never smoked. She has never used smokeless tobacco. She reports that she does not drink alcohol or use drugs.     Maternal Diabetes: No Genetic Screening: Normal Maternal Ultrasounds/Referrals: Normal Fetal Ultrasounds or other Referrals:  None Maternal Substance Abuse:  No Significant Maternal Medications:  None Significant Maternal Lab Results:  None Other Comments:  None  Review of Systems  Constitutional: Negative.   All other systems reviewed and are negative.  Maternal Medical History:  Reason for admission: Rupture of membranes.   Contractions: Onset was less than 1 hour ago.   Frequency: rare.   Perceived severity is mild.    Fetal activity: Perceived fetal activity is normal.   Last perceived fetal movement was within the past hour.    Prenatal complications: Placental abnormality.   Prenatal Complications - Diabetes: none.    Dilation: 4 Effacement (%): 60 Station: -2 Exam by:: Tia Masker, RN Blood pressure 122/71, pulse 78, temperature 97.8 F (36.6 C), temperature source Oral, resp. rate 20, height 5\' 4"  (1.626 m), weight 69.4 kg (153 lb), last menstrual period 04/29/2015, SpO2 97 %. Maternal Exam:  Uterine Assessment:  Contraction strength is mild.  Contraction frequency is rare.   Abdomen: Patient reports no abdominal tenderness. Fetal presentation: vertex  Introitus: Normal vulva. Normal vagina.  Ferning test: positive.  Nitrazine test: positive. Amniotic fluid character: clear.  Pelvis: adequate for delivery.   Cervix: Cervix evaluated by digital exam.     Physical Exam  Nursing note and vitals reviewed. Constitutional: She is oriented to person, place, and time. She appears well-developed and well-nourished.  HENT:  Head: Normocephalic and atraumatic.  Neck: Normal range of motion. Neck supple.  Cardiovascular: Normal rate and regular rhythm.   Respiratory: Effort normal and breath sounds normal.  GI: Soft. Bowel sounds are normal.  Genitourinary: Vagina normal.  Musculoskeletal: Normal range of motion.  Neurological: She is alert and oriented to person, place, and time. She has normal reflexes.  Skin: Skin is warm and dry.  Psychiatric: She has a normal mood and affect.    Prenatal labs: ABO, Rh: --/--/O POS, O POS (11/25 0329) Antibody: NEG (11/25 0329) Rubella:   RPR:    HBsAg:    HIV:    GBS:     Assessment/Plan:  Mono C Di A twins Vertex/vertex twins Admit   Lovenia Kim 01/08/2016, 8:03 AM

## 2016-01-08 NOTE — Anesthesia Pain Management Evaluation Note (Signed)
  CRNA Pain Management Visit Note  Patient: Latasha Smith, 30 y.o., female  "Hello I am a member of the anesthesia team at [facility]. We have an anesthesia team available at all times to provide care throughout the hospital, including epidural management and anesthesia for C-section. I don't know your plan for the delivery whether it a natural birth, water birth, IV sedation, nitrous supplementation, doula or epidural, but we want to meet your pain goals."   1.Was your pain managed to your expectations on prior hospitalizations?   Yes   2.What is your expectation for pain management during this hospitalization?     Epidural  3.How can we help you reach that goal? Patient just got epidural at time of visit  Record the patient's initial score and the patient's pain goal.   Pain: 9  Pain Goal: 5 The [facility] wants you to be able to say your pain was always managed very well.  Rayvon Char 01/08/2016

## 2016-01-08 NOTE — Anesthesia Preprocedure Evaluation (Signed)
Anesthesia Evaluation  Patient identified by MRN, date of birth, ID band Patient awake    Reviewed: Allergy & Precautions, H&P , NPO status , Patient's Chart, lab work & pertinent test results  Airway Mallampati: I  TM Distance: >3 FB Neck ROM: full    Dental no notable dental hx.    Pulmonary    Pulmonary exam normal        Cardiovascular negative cardio ROS Normal cardiovascular exam     Neuro/Psych negative psych ROS   GI/Hepatic negative GI ROS, Neg liver ROS,   Endo/Other  negative endocrine ROS  Renal/GU negative Renal ROS     Musculoskeletal   Abdominal Normal abdominal exam  (+)   Peds  Hematology negative hematology ROS (+)   Anesthesia Other Findings   Reproductive/Obstetrics (+) Pregnancy                             Anesthesia Physical Anesthesia Plan  ASA: II  Anesthesia Plan: Epidural   Post-op Pain Management:    Induction:   Airway Management Planned:   Additional Equipment:   Intra-op Plan:   Post-operative Plan:   Informed Consent: I have reviewed the patients History and Physical, chart, labs and discussed the procedure including the risks, benefits and alternatives for the proposed anesthesia with the patient or authorized representative who has indicated his/her understanding and acceptance.     Plan Discussed with:   Anesthesia Plan Comments:         Anesthesia Quick Evaluation

## 2016-01-08 NOTE — Lactation Note (Signed)
This note was copied from a baby's chart. Lactation Consultation Note Mom w/twins, has toddler at home whom she BF for 4 months, then milk supply dwindled away. Mom unsure why. Wants to BF twins longer. Mom has DEBP at home. Mom shown how to use DEBP & how to disassemble, clean, & reassemble parts. Mom knows to pump q3h for 15-20 min. LPI information sheet given and reviewed. Discussed supplementing after BF. Hand expression and pumping to get supplementation of colostrum to be given first then give formula according to amount total needed. Parents states has understanding. Hand expression taught w/easy flow of thick rich colostrum. FOB taught hand expression and doing it when [place] leaving to give baby "B" d/t not BF.  Mom has everted nipples w/bulbous areolas. Easily compressible to obtain deep latch.  Baby"A" has latched well and BF well according to RN and parents.  Discussed BF twins, supply and demand, importance of STS, I&O. Mom encouraged to feed baby 8-12 times/24 hours and with feeding cues. Mom encouraged to waken baby for feeds.  [place] brochure given w/resources, support groups and [place] services. Patient Name: Latasha Smith [id number] Date: 01/08/2016 Reason for consult: Initial assessment;Multiple gestation;Infant < 6lbs;Late preterm infant   Maternal Data Has patient been taught Hand Expression?: Yes Does the patient have breastfeeding experience prior to this delivery?: Yes  Feeding    LATCH Score/Interventions       Type of Nipple: Everted at rest and after stimulation  Comfort (Breast/Nipple): Soft / non-tender           Lactation Tools Discussed/Used Tools: Pump Breast pump type: Double-Electric Breast Pump Pump Review: Setup, frequency, and cleaning;Milk Storage Initiated by:: Allayne Stack RN IBCLC Date initiated:: 01/08/16   Consult Status Consult Status: Follow-up Date: 01/09/16 Follow-up type: In-patient    Theodoro Kalata 01/08/2016, 4:15  PM

## 2016-01-08 NOTE — Anesthesia Postprocedure Evaluation (Signed)
Anesthesia Post Note  Patient: Latasha Smith  Procedure(s) Performed: * No procedures listed *  Patient location during evaluation: Mother Baby Anesthesia Type: Epidural Level of consciousness: awake Pain management: pain level controlled Respiratory status: spontaneous breathing Cardiovascular status: stable Postop Assessment: no headache, no backache, epidural receding, patient able to bend at knees and no signs of nausea or vomiting Anesthetic complications: no     Last Vitals:  Vitals:   01/08/16 1220 01/08/16 1710  BP: (!) 105/57 113/61  Pulse: 84 79  Resp: 18 16  Temp: 36.8 C 36.7 C    Last Pain:  Vitals:   01/08/16 1710  TempSrc: Oral  PainSc: 0-No pain   Pain Goal:                 Kadee Philyaw JR,JOHN Criag Wicklund

## 2016-01-09 LAB — CBC
HEMATOCRIT: 30.7 % — AB (ref 36.0–46.0)
HEMOGLOBIN: 10.3 g/dL — AB (ref 12.0–15.0)
MCH: 27.8 pg (ref 26.0–34.0)
MCHC: 33.6 g/dL (ref 30.0–36.0)
MCV: 83 fL (ref 78.0–100.0)
Platelets: 196 10*3/uL (ref 150–400)
RBC: 3.7 MIL/uL — AB (ref 3.87–5.11)
RDW: 13.9 % (ref 11.5–15.5)
WBC: 9.3 10*3/uL (ref 4.0–10.5)

## 2016-01-09 NOTE — Lactation Note (Signed)
This note was copied from a baby's chart. Lactation Consultation Note  Patient Name: Latasha Smith [id number] Date: 01/09/2016   Visited with Mom and FOB, babies are 57 hrs old. Babies sleeping on parent's chests currently.  Babies are both at 4% WL today.  Both are being bottle fed, while Mom is pumping.   Obtaining 10-20 ml of colostrum at each pumping, and dividing this up for babies, along with Alimentum formula.  Today, babies are taking 15-20 ml EBM+/formula at each feeding by bottle.  Encouraged STS and feeding on cue, or at least every 3 hrs.  Mom asked about trying to latch babies, and encouraged her to.  Offered assist prn.  Babies to be limited to 30 minutes at the breast per feeding as per LPT policy handout.  Advised to continue with supplementation irregardless of whether baby has latched and breastfed.  Parents understand.   Talked about possible [employer] pump rental at discharge.  Talked about following up with OP lactation appointment for feeding assessment.   Parents wondering when babies will be discharged.  Talked about NP note stating that not until babies stop losing weight and stabilize.  Praised parents for doing such a wonderful job with their babies.   Encouraged them to ask for assistance as needed.  Reviewed cleaning routine after pumping.  Basin/toothbrush and soap and towel explained.     Broadus John 01/09/2016, 2:31 PM

## 2016-01-09 NOTE — Anesthesia Postprocedure Evaluation (Signed)
Anesthesia Post Note  Patient: Latasha Smith  Procedure(s) Performed: * No procedures listed *  Patient location during evaluation: Mother Baby Anesthesia Type: Epidural Level of consciousness: awake and alert Pain management: pain level controlled Vital Signs Assessment: post-procedure vital signs reviewed and stable Respiratory status: spontaneous breathing, nonlabored ventilation and respiratory function stable Cardiovascular status: stable Postop Assessment: no headache, no backache, epidural receding and patient able to bend at knees Anesthetic complications: no     Last Vitals:  Vitals:   01/08/16 2359 01/09/16 0645  BP: 118/61 99/64  Pulse: 79 (!) 59  Resp: 18 18  Temp: 36.8 C 36.9 C    Last Pain:  Vitals:   01/09/16 0645  TempSrc: Oral  PainSc:    Pain Goal:                 Rayvon Char

## 2016-01-09 NOTE — Progress Notes (Signed)
PPD 1 SVD - twins  S:  Reports feeling well - better than last PP             Tolerating po/ No nausea or vomiting             Bleeding is moderate             Pain controlled with motrin             Up ad lib / ambulatory / voiding QS  Newborn twin boys breastfeeding              Circumcision planned O:               VS: BP 99/64   Pulse (!) 59   Temp 98.5 F (36.9 C) (Oral)   Resp 18   Ht 5\' 4"  (1.626 m)   Wt 69.4 kg (153 lb)   LMP 04/29/2015   SpO2 98%   Breastfeeding? Unknown   BMI 26.26 kg/m    LABS:              Recent Labs  01/08/16 0329 01/09/16 0509  WBC 8.6 9.3  HGB 11.3* 10.3*  PLT 239 196               Blood type: --/--/O POS, O POS (11/25 0329)  Rubella:    Immune                            Physical Exam:             Alert and oriented X3  Abdomen: soft, non-tender, non-distended              Fundus: firm, non-tender, Ueven  Perineum: no edema  Lochia: light - small  Extremities: no edema, no calf pain or tenderness    A: PPD # 1 SVD - (intact) with twins   Doing well - stable status  P: Routine post partum orders  DC tomorrow  Artelia Laroche CNM, MSN, [facility] 01/09/2016, 8:20 AM

## 2016-01-09 NOTE — Addendum Note (Signed)
Addendum  created 01/09/16 [id number] by Rayvon Char, CRNA   Sign clinical note

## 2016-01-10 ENCOUNTER — Telehealth (HOSPITAL_COMMUNITY): Payer: Self-pay | Admitting: *Deleted

## 2016-01-10 MED ORDER — COCONUT OIL OIL: 1.0000 "application " | TOPICAL_OIL | 0 refills | Status: DC | PRN

## 2016-01-10 MED ORDER — IBUPROFEN 600 MG PO TABS: 600.0000 mg | ORAL_TABLET | Freq: Four times a day (QID) | ORAL | 0 refills | Status: DC

## 2016-01-10 MED ORDER — BENZOCAINE-MENTHOL 20-0.5 % EX AERO: 1.0000 "application " | INHALATION_SPRAY | CUTANEOUS | Status: DC | PRN

## 2016-01-10 NOTE — Telephone Encounter (Signed)
Preadmission screen  

## 2016-01-10 NOTE — Discharge Summary (Signed)
Obstetric Discharge Summary Reason for Admission: onset of labor and twins, preterm Prenatal Procedures: ultrasound Intrapartum Procedures: spontaneous vaginal delivery and vacuum Postpartum Procedures: none Complications-Operative and Postpartum: none Hemoglobin  Date Value Ref Range Status  01/09/2016 10.3 (L) 12.0 - 15.0 g/dL Final   HGB  Date Value Ref Range Status  02/22/2014 13.2 12.0 - 16.0 g/dL Final   HCT  Date Value Ref Range Status  01/09/2016 30.7 (L) 36.0 - 46.0 % Final  02/23/2014 35.4 35.0 - 47.0 % Final    Physical Exam:  General: alert, cooperative and no distress Lochia: appropriate Uterine Fundus: firm Incision: NA DVT Evaluation: No cords or calf tenderness. No significant calf/ankle edema.  Discharge Diagnoses: Late preterm, delivered, twins  Discharge Information: Date: 01/10/2016 Activity: pelvic rest Diet: routine Medications: PNV and Ibuprofen Condition: stable Instructions: refer to practice specific booklet Discharge to: boarding status Follow-up Information    Lovenia Kim, MD. Schedule an appointment as soon as possible for a visit in 6 week(s).   Specialty:  Obstetrics and Gynecology Contact information: [place] [id number] [phone]           Newborn Data:   Anessa, Galanis [id number]  Live born female [place] Birth Weight: 5 lb 5.9 oz (2435 g) APGAR: 8, 9   Shane[place], Ferone [id number]  Live born female [place] Birth Weight: 5 lb 3.1 oz (2355 g) APGAR: 8, 9  Remain inpatient for weight check  Juliene Pina, CNM 01/10/2016, 11:13 AM

## 2016-01-10 NOTE — Lactation Note (Signed)
This note was copied from a baby's chart. Lactation Consultation Note  Patient Name: Latasha Smith [id number] Date: 01/10/2016 Reason for consult: Follow-up assessment  LPI twins, 19 hours old and asleep in crib. Mom reports that she is attempting to latch each baby at the beginning of each feed, then supplementing with formula. Mom reports that she is post-pumping and getting a total of 10 ml per day right now. Parents had questions about what formula to use in the hospital and at home. Discussed rationale for no longer using Alimentum--since babies not getting much EBM right now, and enc parents to discuss use of the remaining Enfamil they have at home from first child with the babies' pediatrician. Parents report that the babies are not being discharged today. Offered to assist with latching the babies at the breast, but mom declined. Enc parents to call for assistance as needed.   Enc parents to put babies to breast with cues and at least every 3 hours--waking as needed. Enc parents to supplement with EBM/formula according to guidelines--and enc offering 25-30 ml with each feeding through the next 24 hours. Enc mom to continue post-pumping. Mom reports that she has a DEBP at home. Discussed the benefits of hospital-grade DEBP and 2-week rental program. Discussed how to work toward to breastfeeding both children simultaneously.   Discussed assessment and interventions with patient's bedside RN, Karna Christmas.  Maternal Data    Feeding    LATCH Score/Interventions                      Lactation Tools Discussed/Used Breast pump type: Double-Electric Breast Pump   Consult Status Consult Status: Follow-up Date: 01/11/16 Follow-up type: In-patient    Andres Labrum 01/10/2016, 10:26 AM

## 2016-01-10 NOTE — Progress Notes (Signed)
Post Partum Day #2           Information for the patient's newborn:  Roanna, Schambach [id number]  female Information for the patient's newborn:  Ciya, Franckowiak [id number]  female  circumcision completed x 2 Baby names: A: Zack                        B: Landon Feeding: breast and bottle  Subjective: No HA, SOB, CP, F/C, breast symptoms. Pain minimal. Normal vaginal bleeding, no clots.      Objective:  VS:  Vitals:   01/08/16 2359 01/09/16 0645 01/09/16 1800 01/10/16 0615  BP: 118/61 99/64 (!) 109/56 (!) 96/55  Pulse: 79 (!) 59 79 (!) 57  Resp: 18 18 19 18   Temp: 98.2 F (36.8 C) 98.5 F (36.9 C) 98.5 F (36.9 C) 98.4 F (36.9 C)  TempSrc: Oral Oral  Oral  SpO2: 98%     Weight:      Height:        No intake or output data in the 24 hours ending 01/10/16 1033     Recent Labs  01/08/16 0329 01/09/16 0509  WBC 8.6 9.3  HGB 11.3* 10.3*  HCT 33.4* 30.7*  PLT 239 196    Blood type: --/--/O POS, O POS (11/25 0329) Rubella:   immune   Physical Exam:  General: alert, cooperative and no distress Uterine Fundus: firm Lochia: appropriate Perineum: intact, edema none DVT Evaluation: No cords or calf tenderness. No significant calf/ankle edema.    Assessment/Plan: PPD # 2 / 30 y.o., G2P1103    Principal Problem:   Postpartum care following vaginal delivery twins(11/25) Active Problems:   Normal labor   SVD (spontaneous vaginal delivery) twins    normal postpartum exam  Continue current postpartum care  D/C to boarding status   LOS: 2 days   Juliene Pina, CNM, MSN 01/10/2016, 10:33 AM

## 2016-01-11 ENCOUNTER — Ambulatory Visit: Payer: Self-pay

## 2016-01-11 NOTE — Lactation Note (Signed)
This note was copied from a baby's chart. Lactation Consultation Note; Mom bottle feeding formula to babies as I went into room. Reports her breasts are feeling much fuller this morning. Reviewed importance of frequent nursing or pumping to promote milk supply and prevent engorgement. Reports babies are nursing some. Reviewed engorgement prevention and treatment. Reviewed our phone number and OP appointments as resources for support after DC. Encouraged to call for OP as babies get bigger for assist with latch. No questions at present. To call prn  Patient Name: Jynelle Depaola [id number] Date: 01/11/2016 Reason for consult: Follow-up assessment;Multiple gestation;Infant < 6lbs;Late preterm infant   Maternal Data Does the patient have breastfeeding experience prior to this delivery?: Yes  Feeding Feeding Type: Formula Nipple Type: Slow - flow  LATCH Score/Interventions                      Lactation Tools Discussed/Used WIC Program: No   Consult Status Consult Status: Complete    Truddie Crumble 01/11/2016, 9:55 AM

## 2016-01-12 ENCOUNTER — Inpatient Hospital Stay (HOSPITAL_COMMUNITY): Admission: RE | Admit: 2016-01-12 | Payer: Commercial Managed Care - HMO | Source: Ambulatory Visit

## 2016-02-23 ENCOUNTER — Telehealth: Payer: Self-pay | Admitting: Family Medicine

## 2016-02-23 ENCOUNTER — Ambulatory Visit: Payer: Commercial Managed Care - HMO | Admitting: Family Medicine

## 2016-02-23 NOTE — Telephone Encounter (Signed)
Dr.Cook is aware.

## 2016-02-23 NOTE — Telephone Encounter (Signed)
FYI - Pt cancelled appt this morning due to a sick child.

## 2016-02-25 ENCOUNTER — Encounter: Payer: Self-pay | Admitting: Family Medicine

## 2016-02-25 ENCOUNTER — Ambulatory Visit (INDEPENDENT_AMBULATORY_CARE_PROVIDER_SITE_OTHER): Payer: Commercial Managed Care - HMO | Admitting: Family Medicine

## 2016-02-25 DIAGNOSIS — J069 Acute upper respiratory infection, unspecified: Secondary | ICD-10-CM | POA: Diagnosis not present

## 2016-02-25 MED ORDER — AMOXICILLIN-POT CLAVULANATE 875-125 MG PO TABS: 1.0000 | ORAL_TABLET | Freq: Two times a day (BID) | ORAL | 0 refills | Status: DC

## 2016-02-25 NOTE — Progress Notes (Signed)
Pre visit review using our clinic review tool, if applicable. No additional management support is needed unless otherwise documented below in the visit note. 

## 2016-02-25 NOTE — Patient Instructions (Signed)
Supportive care.  Take the antibiotic if you worsen.  Take care  Dr. Lacinda Axon

## 2016-02-25 NOTE — Progress Notes (Signed)
   Subjective:  Patient ID: Latasha Smith, female    DOB: 05/31/1985  Age: 31 y.o. MRN: [id number]  CC: Concern sinusitis  HPI:  31 year old female who is recently status post vaginal delivery of twins (November) presents with concerns for sinusitis.  Patient states she's been sick for the past 3 weeks. She's had fatigue, cough, congestion, runny nose, sore throat, sinus headache. Her twins have recently been diagnosed with RSV. No known exacerbating or relieving factors. No associated fever. She has seen some discolored mucus. No other complaints or concerns. Symptoms have recently been improving. She is concerned about the discolored mucus and is worried about sinus infection.  Social Hx   Social History   Social History  . Marital status: Married    Spouse name: N/A  . Number of children: N/A  . Years of education: N/A   Social History Main Topics  . Smoking status: Never Smoker  . Smokeless tobacco: Never Used  . Alcohol use No  . Drug use: No  . Sexual activity: Yes    Birth control/ protection: Condom   Other Topics Concern  . None   Social History Narrative  . None    Review of Systems  Constitutional: Positive for fatigue. Negative for fever.  HENT: Positive for congestion, rhinorrhea, sinus pressure and sore throat.   Neurological: Positive for headaches.   Objective:  BP 109/72   Pulse 78   Temp 97.6 F (36.4 C) (Oral)   Resp 12   Wt 128 lb 12.8 oz (58.4 kg)   SpO2 100%   BMI 22.11 kg/m   BP/Weight 02/25/2016 01/10/2016 [id number]  Systolic BP 0000000 96 -  Diastolic BP 72 55 -  Wt. (Lbs) 128.8 - 153  BMI 22.11 - 26.26   Physical Exam  Constitutional: She is oriented to person, place, and time. She appears well-developed. No distress.  HENT:  Head: Normocephalic and atraumatic.  Mouth/Throat: Oropharynx is clear and moist.  Normal TMs bilaterally.  Cardiovascular: Normal rate and regular rhythm.   Pulmonary/Chest: Effort normal and breath  sounds normal. She has no wheezes. She has no rales.  Neurological: She is alert and oriented to person, place, and time.  Psychiatric: She has a normal mood and affect.  Vitals reviewed.  Lab Results  Component Value Date   WBC 9.3 01/09/2016   HGB 10.3 (L) 01/09/2016   HCT 30.7 (L) 01/09/2016   PLT 196 01/09/2016   GLUCOSE 93 02/17/2015   ALT 15 02/17/2015   AST 18 02/17/2015   NA 139 02/17/2015   K 3.8 02/17/2015   CL 103 02/17/2015   CREATININE 0.66 02/17/2015   BUN 16 02/17/2015   CO2 30 02/17/2015   TSH 3.63 02/17/2015    Assessment & Plan:   Problem List Items Addressed This Visit    URI (upper respiratory infection)    New acute illness. Lengthy discussion with the patient today that this is likely viral. She is currently improving. Continue supportive. Augmentin to be fill if she fails to improve or worsens.         Meds ordered this encounter  Medications  . amoxicillin-clavulanate (AUGMENTIN) 875-125 MG tablet    Sig: Take 1 tablet by mouth 2 (two) times daily.    Dispense:  20 tablet    Refill:  0    Follow-up: PRN  [place]

## 2016-02-25 NOTE — Assessment & Plan Note (Signed)
New acute illness. Lengthy discussion with the patient today that this is likely viral. She is currently improving. Continue supportive. Augmentin to be fill if she fails to improve or worsens.

## 2016-04-27 ENCOUNTER — Ambulatory Visit (INDEPENDENT_AMBULATORY_CARE_PROVIDER_SITE_OTHER): Payer: Self-pay | Admitting: Vascular Surgery

## 2016-05-08 ENCOUNTER — Ambulatory Visit (INDEPENDENT_AMBULATORY_CARE_PROVIDER_SITE_OTHER): Payer: Self-pay | Admitting: Vascular Surgery

## 2016-12-07 ENCOUNTER — Ambulatory Visit: Payer: Self-pay | Admitting: Family Medicine

## 2017-02-23 ENCOUNTER — Ambulatory Visit (INDEPENDENT_AMBULATORY_CARE_PROVIDER_SITE_OTHER): Payer: 59 | Admitting: Psychology

## 2017-02-23 DIAGNOSIS — F329 Major depressive disorder, single episode, unspecified: Secondary | ICD-10-CM

## 2017-02-23 DIAGNOSIS — F4322 Adjustment disorder with anxiety: Secondary | ICD-10-CM

## 2017-03-01 ENCOUNTER — Ambulatory Visit: Payer: 59 | Admitting: Psychology

## 2017-04-02 ENCOUNTER — Ambulatory Visit (INDEPENDENT_AMBULATORY_CARE_PROVIDER_SITE_OTHER): Payer: BC Managed Care – PPO | Admitting: Family Medicine

## 2017-04-02 ENCOUNTER — Encounter: Payer: Self-pay | Admitting: Family Medicine

## 2017-04-02 VITALS — BP 112/62 | HR 61 | Temp 97.8°F | Resp 16 | Ht 64.0 in | Wt 124.0 lb

## 2017-04-02 DIAGNOSIS — R519 Headache, unspecified: Secondary | ICD-10-CM

## 2017-04-02 DIAGNOSIS — F322 Major depressive disorder, single episode, severe without psychotic features: Secondary | ICD-10-CM | POA: Diagnosis not present

## 2017-04-02 DIAGNOSIS — F329 Major depressive disorder, single episode, unspecified: Secondary | ICD-10-CM | POA: Insufficient documentation

## 2017-04-02 DIAGNOSIS — F411 Generalized anxiety disorder: Secondary | ICD-10-CM

## 2017-04-02 DIAGNOSIS — R51 Headache: Secondary | ICD-10-CM

## 2017-04-02 DIAGNOSIS — G4440 Drug-induced headache, not elsewhere classified, not intractable: Secondary | ICD-10-CM | POA: Insufficient documentation

## 2017-04-02 NOTE — Patient Instructions (Addendum)
Look into telemedicine Psychotherapy/Psychologist/Therapist   Analgesic Rebound Headache An analgesic rebound headache, sometimes called a medication overuse headache, is a headache that comes after pain medicine (analgesic) taken to treat the original (primary) headache has worn off. Any type of primary headache can return as a rebound headache if a person regularly takes analgesics more than three times a week to treat it. The types of primary headaches that are commonly associated with rebound headaches include:  Migraines.  Headaches that arise from tense muscles in the head and neck area (tension headaches).  Headaches that develop and happen again (recur) on one side of the head and around the eye (cluster headaches).  If rebound headaches continue, they become chronic daily headaches. What are the causes? This condition may be caused by frequent use of:  Over-the-counter medicines such as aspirin, ibuprofen, and acetaminophen.  Sinus relief medicines and other medicines that contain caffeine.  Narcotic pain medicines such as codeine and oxycodone.  What are the signs or symptoms? The symptoms of a rebound headache are the same as the symptoms of the original headache. Some of the symptoms of specific types of headaches include: Migraine headache  Pulsing or throbbing pain on one or both sides of the head.  Severe pain that interferes with daily activities.  Pain that is worsened by physical activity.  Nausea, vomiting, or both.  Pain with exposure to bright light, loud noises, or strong smells.  General sensitivity to bright light, loud noises, or strong smells.  Visual changes.  Numbness of one or both arms. Tension headache  Pressure around the head.  Dull, aching head pain.  Pain felt over the front and sides of the head.  Tenderness in the muscles of the head, neck, and shoulders. Cluster headache  Severe pain that begins in or around one eye or  temple.  Redness and tearing in the eye on the same side as the pain.  Droopy or swollen eyelid.  One-sided head pain.  Nausea.  Runny nose.  Sweaty, pale facial skin.  Restlessness. How is this diagnosed? This condition is diagnosed by:  Reviewing your medical history. This includes the nature of your primary headaches.  Reviewing the types of pain medicines that you have been using to treat your headaches and how often you take them.  How is this treated? This condition may be treated or managed by:  Discontinuing frequent use of the analgesic medicine. Doing this may worsen your headaches at first, but the pain should eventually become more manageable, less frequent, and less severe.  Seeing a headache specialist. He or she may be able to help you manage your headaches and help make sure there is not another cause of the headaches.  Using methods of stress relief, such as acupuncture, counseling, biofeedback, and massage. Talk with your health care provider about which methods might be good for you.  Follow these instructions at home:  Take over-the-counter and prescription medicines only as told by your health care provider.  Stop the repeated use of pain medicine as told by your health care provider. Stopping can be difficult. Carefully follow instructions from your health care provider.  Avoid triggers that are known to cause your primary headaches.  Keep all follow-up visits as told by your health care provider. This is important. Contact a health care provider if:  You continue to experience headaches after following treatments that your health care provider recommended. Get help right away if:  You develop new headache pain.  You  develop headache pain that is different than what you have experienced in the past.  You develop numbness or tingling in your arms or legs.  You develop changes in your speech or vision. This information is not intended to replace  advice given to you by your health care provider. Make sure you discuss any questions you have with your health care provider. Document Released: 04/22/2003 Document Revised: 08/20/2015 Document Reviewed: 07/05/2015 Elsevier Interactive Patient Education  [employer].

## 2017-04-02 NOTE — Progress Notes (Signed)
Patient: Latasha Smith, Female    DOB: 1985/08/04, 32 y.o.   MRN: [id number] Visit Date: 04/02/2017  Today's Provider: Lavon Paganini, MD   I, Martha Clan, CMA, am acting as scribe for Lavon Paganini, MD.  Chief Complaint  Patient presents with  . Establish Care  . Depression   Subjective:    Annual physical exam Latasha Smith is a 32 y.o. female who presents today for health maintenance and complete physical. She feels fairly well. She reports exercising none. She reports she is sleeping poorly.  Latasha Smith is c/o anxiety and depression. She has this problem for "a couple of years". She states this worsening, and has been severe over the last 4 months since going back to work after staying home with 3 kids. She has never been on medication for this in the past. She denies suicidal ideation. She is c/o decreased concentration, agitation, and sleep disturbance.  Worries about her children and her mother who takes care  ----------------------------------------------------------------- Migraines: No aura.  Chronic since mid 20s.  Occurring nearly daily. Taking ibuprofen prn. Pain is across forehead, b/l.  No radiation.  +nausea with severe ones. +photo/phonophobia  H/o asthma in childhood: Grew out of it  Review of Systems  Constitutional: Positive for activity change, appetite change and fatigue.  HENT: Positive for sinus pressure.   Eyes: Positive for photophobia and itching.  Respiratory: Positive for chest tightness.   Cardiovascular: Positive for chest pain.  Endocrine: Positive for polydipsia and polyphagia.  Neurological: Positive for light-headedness and headaches.  Psychiatric/Behavioral: Positive for agitation, decreased concentration, dysphoric mood and sleep disturbance. The patient is nervous/anxious.   All other systems reviewed and are negative.   Social History      She  reports that  has never smoked. she has never used smokeless  tobacco. She reports that she drinks about 1.8 oz of alcohol per week. She reports that she does not use drugs.       Social History   Socioeconomic History  . Marital status: Married    Spouse name: Latasha Smith  . Number of children: 3  . Years of education: Not on file  . Highest education level: Master's degree (e.g., MA, MS, MEng, MEd, MSW, MBA)  Social Needs  . Financial resource strain: Not hard at all  . Food insecurity - worry: Never true  . Food insecurity - inability: Never true  . Transportation needs - medical: No  . Transportation needs - non-medical: No  Occupational History    Employer: [employer]  Tobacco Use  . Smoking status: Never Smoker  . Smokeless tobacco: Never Used  Substance and Sexual Activity  . Alcohol use: Yes    Alcohol/week: 1.8 oz    Types: 3 Glasses of wine per week  . Drug use: No  . Sexual activity: Yes    Birth control/protection: Surgical  Other Topics Concern  . Not on file  Social History Narrative  . Not on file    Past Medical History:  Diagnosis Date  . Asthma    As a child  . Chicken pox   . History of UTI   . Migraine      Patient Active Problem List   Diagnosis Date Noted  . URI (upper respiratory infection) 02/25/2016    Past Surgical History:  Procedure Laterality Date  . NO PAST SURGERIES      Family History        Family Status  Relation  Name Status  . PGF  (Not Specified)  . MGM  (Not Specified)  . MGF  (Not Specified)  . PGM  (Not Specified)  . Mother  Alive  . Father  Alive  . Sister  Alive  . Brother  Alive  . Neg Hx  (Not Specified)        Her family history includes Alcoholism in her paternal grandfather; Arthritis in her maternal grandmother; Diabetes in her maternal grandfather; Healthy in her brother, father, mother, and sister; Heart disease in her maternal grandfather; Hypertension in her maternal grandfather; Lung cancer in her paternal grandfather; Stroke in her paternal  grandmother. There is no history of Colon cancer, Breast cancer, Ovarian cancer, or Cervical cancer.      No Known Allergies  No current outpatient medications on file.   Patient Care Team: Virginia Crews, MD as PCP - General (Family Medicine)      Objective:   Vitals: BP 112/62 (BP Location: Left Arm, Patient Position: Sitting, Cuff Size: Normal)   Pulse 61   Temp 97.8 F (36.6 C) (Oral)   Resp 16   Ht 5\' 4"  (1.626 m)   Wt 124 lb (56.2 kg)   LMP 03/02/2017   SpO2 99%   Breastfeeding? No   BMI 21.28 kg/m    Vitals:   04/02/17 1013  BP: 112/62  Pulse: 61  Resp: 16  Temp: 97.8 F (36.6 C)  TempSrc: Oral  SpO2: 99%  Weight: 124 lb (56.2 kg)  Height: 5\' 4"  (1.626 m)     Physical Exam  Constitutional: She is oriented to person, place, and time. She appears well-developed and well-nourished. No distress.  HENT:  Head: Normocephalic and atraumatic.  Right Ear: External ear normal.  Left Ear: External ear normal.  Nose: Nose normal.  Mouth/Throat: Oropharynx is clear and moist.  Eyes: Conjunctivae and EOM are normal. Pupils are equal, round, and reactive to light. No scleral icterus.  Neck: Neck supple. No thyromegaly present.  Cardiovascular: Normal rate, regular rhythm, normal heart sounds and intact distal pulses.  No murmur heard. Pulmonary/Chest: Breath sounds normal. No respiratory distress. She has no wheezes. She has no rales.  Abdominal: Soft. Bowel sounds are normal. She exhibits no distension. There is no tenderness. There is no rebound and no guarding.  Musculoskeletal: She exhibits no edema or deformity.  Lymphadenopathy:    She has no cervical adenopathy.  Neurological: She is alert and oriented to person, place, and time.  Skin: Skin is warm and dry. No rash noted.  Psychiatric: Her speech is normal and behavior is normal. Judgment normal. Her mood appears anxious. Cognition and memory are normal. She exhibits a depressed mood. She expresses no  homicidal and no suicidal ideation.  Vitals reviewed.    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 Discussed cutting out NSAIDs and cutting back on caffeine  Could consider prophylaxis in the future if needed Return precautions discussed      Relevant Orders   TSH (Completed)   Comprehensive metabolic panel (Completed)   CBC w/Diff/Platelet (Completed)   GAD (generalized anxiety disorder)    New diagnosis, but long standing issue Uncontrolled Discussed benefits of therapy - patient to look into telemedicine for psych Joint decision with patient to start SSRI Will start Celexa 20mg  daily Discussed that it can take 6-8 weeks to reach full efficacy, possible GI upset and sexual dysfunction Will check TSH, CBC, CMP for organic causes F/u in 1 month, repeat PHQ9, GAD7 and consider dose titration      Relevant Orders   TSH (Completed)   Comprehensive metabolic panel (Completed)   CBC w/Diff/Platelet (Completed)   MDD (major depressive disorder)    New diagnosis, but long standing issue Uncontrolled No SI/HI, contracted for safety Discussed benefits of therapy - patient to look into telemedicine for psych Joint decision with patient to start SSRI Will start Celexa 20mg  daily Discussed that it can take 6-8 weeks to reach full efficacy, possible GI upset and sexual dysfunction Will check TSH, CBC, CMP for organic causes F/u in 1 month, repeat PHQ9, GAD7 and  consider dose titration      Relevant Orders   TSH (Completed)   Comprehensive metabolic panel (Completed)   CBC w/Diff/Platelet (Completed)       Return in about 4 weeks (around 04/30/2017) for depression/anxiety.   The entirety of the information documented in the History of Present Illness, Review of Systems and Physical Exam were personally obtained by me. Portions of this information were initially documented by Raquel Sarna Ratchford, CMA and reviewed by me for thoroughness and accuracy.    Virginia Crews, MD, MPH [facility] 04/03/2017 9:50 AM

## 2017-04-03 ENCOUNTER — Telehealth: Payer: Self-pay

## 2017-04-03 LAB — COMPREHENSIVE METABOLIC PANEL
ALBUMIN: 4.6 g/dL (ref 3.5–5.5)
ALT: 12 IU/L (ref 0–32)
AST: 13 IU/L (ref 0–40)
Albumin/Globulin Ratio: 2 (ref 1.2–2.2)
Alkaline Phosphatase: 41 IU/L (ref 39–117)
BUN / CREAT RATIO: 21 (ref 9–23)
BUN: 16 mg/dL (ref 6–20)
Bilirubin Total: 0.4 mg/dL (ref 0.0–1.2)
CALCIUM: 9.4 mg/dL (ref 8.7–10.2)
CO2: 23 mmol/L (ref 20–29)
CREATININE: 0.77 mg/dL (ref 0.57–1.00)
Chloride: 104 mmol/L (ref 96–106)
GFR calc Af Amer: 121 mL/min/{1.73_m2} (ref >59)
GFR, EST NON AFRICAN AMERICAN: 105 mL/min/{1.73_m2} (ref >59)
GLOBULIN, TOTAL: 2.3 g/dL (ref 1.5–4.5)
GLUCOSE: 79 mg/dL (ref 65–99)
Potassium: 3.9 mmol/L (ref 3.5–5.2)
SODIUM: 141 mmol/L (ref 134–144)
Total Protein: 6.9 g/dL (ref 6.0–8.5)

## 2017-04-03 LAB — CBC WITH DIFFERENTIAL/PLATELET
BASOS: 1 %
Basophils Absolute: 0 10*3/uL (ref 0.0–0.2)
EOS (ABSOLUTE): 0.4 10*3/uL (ref 0.0–0.4)
EOS: 7 %
HEMATOCRIT: 39.8 % (ref 34.0–46.6)
HEMOGLOBIN: 13.2 g/dL (ref 11.1–15.9)
IMMATURE GRANULOCYTES: 0 %
Immature Grans (Abs): 0 10*3/uL (ref 0.0–0.1)
Lymphocytes Absolute: 2 10*3/uL (ref 0.7–3.1)
Lymphs: 31 %
MCH: 29.5 pg (ref 26.6–33.0)
MCHC: 33.2 g/dL (ref 31.5–35.7)
MCV: 89 fL (ref 79–97)
MONOCYTES: 6 %
Monocytes Absolute: 0.4 10*3/uL (ref 0.1–0.9)
NEUTROS PCT: 55 %
Neutrophils Absolute: 3.5 10*3/uL (ref 1.4–7.0)
Platelets: 246 10*3/uL (ref 150–379)
RBC: 4.47 x10E6/uL (ref 3.77–5.28)
RDW: 13.8 % (ref 12.3–15.4)
WBC: 6.3 10*3/uL (ref 3.4–10.8)

## 2017-04-03 LAB — TSH: TSH: 1.97 u[IU]/mL (ref 0.450–4.500)

## 2017-04-03 NOTE — Assessment & Plan Note (Signed)
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

## 2017-04-03 NOTE — Telephone Encounter (Signed)
-----   Message from Virginia Crews, MD sent at 04/03/2017 11:14 AM EST ----- Normal Thyroid function, Blood counts, kidney function, liver function, electrolytes.  Virginia Crews, MD, MPH [facility] 04/03/2017 11:14 AM

## 2017-04-03 NOTE — Telephone Encounter (Signed)
Left message advising pt. OK per DPR. 

## 2017-04-03 NOTE — Assessment & Plan Note (Signed)
New diagnosis, but long standing issue Uncontrolled No SI/HI, contracted for safety Discussed benefits of therapy - patient to look into telemedicine for psych Joint decision with patient to start SSRI Will start Celexa 20mg  daily Discussed that it can take 6-8 weeks to reach full efficacy, possible GI upset and sexual dysfunction Will check TSH, CBC, CMP for organic causes F/u in 1 month, repeat PHQ9, GAD7 and consider dose titration

## 2017-04-03 NOTE — Assessment & Plan Note (Signed)
New diagnosis, but long standing issue Uncontrolled Discussed benefits of therapy - patient to look into telemedicine for psych Joint decision with patient to start SSRI Will start Celexa 20mg  daily Discussed that it can take 6-8 weeks to reach full efficacy, possible GI upset and sexual dysfunction Will check TSH, CBC, CMP for organic causes F/u in 1 month, repeat PHQ9, GAD7 and consider dose titration

## 2017-04-09 ENCOUNTER — Telehealth: Payer: Self-pay | Admitting: Family Medicine

## 2017-04-09 MED ORDER — CITALOPRAM HYDROBROMIDE 20 MG PO TABS: 20.0000 mg | ORAL_TABLET | Freq: Every day | ORAL | 3 refills | Status: DC

## 2017-04-09 NOTE — Telephone Encounter (Signed)
Pt states the pharmacy never received prescription from pt's LOV on 04/02/2017. Per note, Dr. B wanted to start Celexa 20 mg. Sent this in to [facility].

## 2017-04-09 NOTE — Telephone Encounter (Signed)
Patient is requesting a nurse to call her   CB# [phone]

## 2017-04-27 ENCOUNTER — Ambulatory Visit: Payer: Self-pay | Admitting: Family Medicine

## 2017-05-03 ENCOUNTER — Ambulatory Visit: Payer: Self-pay | Admitting: Family Medicine

## 2017-05-11 ENCOUNTER — Ambulatory Visit (INDEPENDENT_AMBULATORY_CARE_PROVIDER_SITE_OTHER): Payer: BC Managed Care – PPO | Admitting: Family Medicine

## 2017-05-11 ENCOUNTER — Encounter: Payer: Self-pay | Admitting: Family Medicine

## 2017-05-11 VITALS — BP 98/60 | HR 71 | Temp 98.0°F | Resp 16 | Wt 121.6 lb

## 2017-05-11 DIAGNOSIS — F320 Major depressive disorder, single episode, mild: Secondary | ICD-10-CM

## 2017-05-11 DIAGNOSIS — F411 Generalized anxiety disorder: Secondary | ICD-10-CM | POA: Diagnosis not present

## 2017-05-11 NOTE — Progress Notes (Signed)
Patient: Latasha Smith Female    DOB: 04/15/1985   32 y.o.   MRN: [id number] Visit Date: 05/11/2017  Today's Provider: Lavon Paganini, MD   Chief Complaint  Patient presents with  . Follow-up    Anxiety   Subjective:    HPI  GAD, Follow up:  The patient was last seen for GAD 1 months ago. Changes made since that visit include start Celexa 20mg  daily  She reports excellent compliance with treatment. She is not having side effects. Marland Kitchen ------------------------------------------------------------------------ GAD 7 : Generalized Anxiety Score 05/11/2017 04/02/2017  Nervous, Anxious, on Edge 1 3  Control/stop worrying 2 3  Worry too much - different things 2 3  Trouble relaxing 1 3  Restless 1 0  Easily annoyed or irritable 1 3  Afraid - awful might happen 1 3  Total GAD 7 Score 9 18  Anxiety Difficulty Somewhat difficult Extremely difficult   . Depression screen [facility] 2/9 05/11/2017 04/02/2017  Decreased Interest 0 3  Down, Depressed, Hopeless 0 3  PHQ - 2 Score 0 6  Altered sleeping 1 3  Tired, decreased energy 3 3  Change in appetite 2 3  Feeling bad or failure about yourself  0 3  Trouble concentrating 1 2  Moving slowly or fidgety/restless - 0  Suicidal thoughts 0 0  PHQ-9 Score 7 20  Difficult doing work/chores Not difficult at all Very difficult     No Known Allergies   Current Outpatient Medications:  .  citalopram (CELEXA) 20 MG tablet, Take 1 tablet (20 mg total) by mouth daily., Disp: 30 tablet, Rfl: 3  Review of Systems  Respiratory: Negative.  Negative for choking.   Cardiovascular: Negative for chest pain and palpitations.  Neurological: Negative.   Psychiatric/Behavioral: Negative.     Social History   Tobacco Use  . Smoking status: Never Smoker  . Smokeless tobacco: Never Used  Substance Use Topics  . Alcohol use: Yes    Alcohol/week: 1.8 oz    Types: 3 Glasses of wine per week   Objective:   BP 98/60 (BP Location: Left  Arm, Patient Position: Sitting, Cuff Size: Normal)   Pulse 71   Temp 98 F (36.7 C) (Oral)   Resp 16   Wt 121 lb 9.6 oz (55.2 kg)   LMP 04/04/2016 (Approximate)   BMI 20.87 kg/m    Physical Exam  Constitutional: She is oriented to person, place, and time. She appears well-developed and well-nourished. No distress.  HENT:  Head: Normocephalic and atraumatic.  Cardiovascular: Normal rate and regular rhythm.  Pulmonary/Chest: Effort normal. No respiratory distress.  Musculoskeletal: She exhibits no edema.  Neurological: She is alert and oriented to person, place, and time.  Psychiatric: She has a normal mood and affect. Her behavior is normal.  Vitals reviewed.       Assessment & Plan:      Problem List Items Addressed This Visit      Other   GAD (generalized anxiety disorder) - Primary    Chronic, improving Patient is happy with her improvement on current Celexa dose Continue Celexa 20 mg daily      MDD (major depressive disorder)    Chronic, control improving No SI/HI  patient tolerating Celexa well and feels that her anxiety and depression have improved greatly on this dose We will continue Celexa 20 mg daily          Return in about 6 months (around 11/11/2017) for Physical.  The entirety of the information documented in the History of Present Illness, Review of Systems and Physical Exam were personally obtained by me. Portions of this information were initially documented by Lyndel Pleasure, CMA and reviewed by me for thoroughness and accuracy.    Virginia Crews, MD, MPH [facility] 05/14/2017 8:23 AM

## 2017-05-14 NOTE — Assessment & Plan Note (Signed)
Chronic, improving Patient is happy with her improvement on current Celexa dose Continue Celexa 20 mg daily

## 2017-05-14 NOTE — Assessment & Plan Note (Signed)
Chronic, control improving No SI/HI  patient tolerating Celexa well and feels that her anxiety and depression have improved greatly on this dose We will continue Celexa 20 mg daily

## 2017-07-16 ENCOUNTER — Encounter: Payer: Self-pay | Admitting: Family Medicine

## 2017-07-16 ENCOUNTER — Ambulatory Visit (INDEPENDENT_AMBULATORY_CARE_PROVIDER_SITE_OTHER): Payer: BC Managed Care – PPO | Admitting: Family Medicine

## 2017-07-16 VITALS — BP 120/50 | HR 77 | Temp 98.2°F | Resp 16 | Wt 124.0 lb

## 2017-07-16 DIAGNOSIS — N644 Mastodynia: Secondary | ICD-10-CM | POA: Diagnosis not present

## 2017-07-16 DIAGNOSIS — R112 Nausea with vomiting, unspecified: Secondary | ICD-10-CM

## 2017-07-16 LAB — POCT URINE PREGNANCY: PREG TEST UR: NEGATIVE

## 2017-07-16 NOTE — Progress Notes (Signed)
Patient: Latasha Smith Female    DOB: February 21, 1985   32 y.o.   MRN: [id number] Visit Date: 07/16/2017  Today's Provider: Lavon Paganini, MD   I, Martha Clan, CMA, am acting as scribe for Lavon Paganini, MD.  Chief Complaint  Patient presents with  . Possible Pregnancy   Subjective:    HPI   Pt is concerned about a possible pregnancy. LMP was 06/25/2017. Her husband is s/p vasectomy ~1 yr ago and she knows he went for the sperm counts afterward. Her current sx include increased appetite, fatigue, some morning sickness (vomiting in the middle of the night 1-2 times weekly), and sore breasts. She states these were the same sx of her previous pregnancies. States she does not usually have these symptoms before her periods. She did not have any of these symptoms after starting Celexa.   No Known Allergies   Current Outpatient Medications:  .  citalopram (CELEXA) 20 MG tablet, Take 1 tablet (20 mg total) by mouth daily., Disp: 30 tablet, Rfl: 3  Review of Systems  Constitutional: Positive for appetite change and fatigue. Negative for activity change, chills, diaphoresis, fever and unexpected weight change.  Respiratory: Negative.   Cardiovascular: Negative.   Gastrointestinal: Positive for nausea and vomiting. Negative for abdominal distention, abdominal pain, anal bleeding, blood in stool, constipation, diarrhea and rectal pain.  Genitourinary: Negative.  Negative for menstrual problem.  Musculoskeletal: Negative.   Skin: Negative.   Neurological: Negative.   Psychiatric/Behavioral: Negative.     Social History   Tobacco Use  . Smoking status: Never Smoker  . Smokeless tobacco: Never Used  Substance Use Topics  . Alcohol use: Yes    Alcohol/week: 1.8 oz    Types: 3 Glasses of wine per week   Objective:   BP (!) 120/50 (BP Location: Left Arm, Patient Position: Sitting, Cuff Size: Normal)   Pulse 77   Temp 98.2 F (36.8 C) (Oral)   Resp 16   Wt 124  lb (56.2 kg)   LMP 06/25/2017   SpO2 97%   BMI 21.28 kg/m  Vitals:   07/16/17 1331  BP: (!) 120/50  Pulse: 77  Resp: 16  Temp: 98.2 F (36.8 C)  TempSrc: Oral  SpO2: 97%  Weight: 124 lb (56.2 kg)     Physical Exam  Constitutional: She is oriented to person, place, and time. She appears well-developed and well-nourished. No distress.  HENT:  Head: Normocephalic and atraumatic.  Eyes: Conjunctivae are normal. No scleral icterus.  Neck: Neck supple. No thyromegaly present.  Cardiovascular: Normal rate, regular rhythm, normal heart sounds and intact distal pulses.  No murmur heard. Pulmonary/Chest: Effort normal and breath sounds normal. No respiratory distress. She has no wheezes. She has no rales.  Abdominal: Soft. She exhibits no distension. There is no tenderness.  Musculoskeletal: She exhibits no edema.  Lymphadenopathy:    She has no cervical adenopathy.  Neurological: She is alert and oriented to person, place, and time.  Skin: Skin is warm and dry. Capillary refill takes less than 2 seconds. No rash noted.  Psychiatric: She has a normal mood and affect. Her behavior is normal.  Vitals reviewed.      Results for orders placed or performed in visit on 07/16/17  POCT urine pregnancy  Result Value Ref Range   Preg Test, Ur Negative Negative    Assessment & Plan:    1. Soreness breast 2. Non-intractable vomiting with nausea, unspecified vomiting type - patient does  have symptoms consistent with her previous pregnancy - she is too soon from LMP to test positive on UPreg test - will obtain serum HCG to confirm - patient is low risk for pregnancy given husband's h/o vasectomy, so could be PMS symptoms - Beta HCG, Quant   Return if symptoms worsen or fail to improve.   The entirety of the information documented in the History of Present Illness, Review of Systems and Physical Exam were personally obtained by me. Portions of this information were initially documented  by Raquel Sarna Ratchford, CMA and reviewed by me for thoroughness and accuracy.    Virginia Crews, MD, MPH [facility] 07/16/2017 2:02 PM

## 2017-07-17 ENCOUNTER — Telehealth: Payer: Self-pay

## 2017-07-17 LAB — HCG, SERUM, QUALITATIVE: hCG,Beta Subunit,Qual,Serum: NEGATIVE m[IU]/mL (ref <6)

## 2017-07-17 LAB — BETA HCG QUANT (REF LAB)

## 2017-07-17 NOTE — Telephone Encounter (Signed)
-----   Message from Virginia Crews, MD sent at 07/17/2017  8:28 AM EDT ----- Blood test negative for pregnancy  Virginia Crews, MD, MPH [facility] 07/17/2017 8:28 AM

## 2017-07-17 NOTE — Telephone Encounter (Signed)
Left message advising pt. OK per DPR. 

## 2017-07-29 ENCOUNTER — Other Ambulatory Visit: Payer: Self-pay | Admitting: Family Medicine

## 2017-08-14 ENCOUNTER — Ambulatory Visit: Payer: BC Managed Care – PPO | Admitting: Physician Assistant

## 2017-08-14 ENCOUNTER — Encounter: Payer: Self-pay | Admitting: Physician Assistant

## 2017-08-14 VITALS — BP 104/60 | HR 72 | Temp 98.2°F | Resp 16 | Wt 129.0 lb

## 2017-08-14 DIAGNOSIS — R197 Diarrhea, unspecified: Secondary | ICD-10-CM | POA: Diagnosis not present

## 2017-08-14 DIAGNOSIS — R1013 Epigastric pain: Secondary | ICD-10-CM | POA: Diagnosis not present

## 2017-08-14 DIAGNOSIS — R110 Nausea: Secondary | ICD-10-CM | POA: Diagnosis not present

## 2017-08-14 MED ORDER — ONDANSETRON HCL 4 MG PO TABS: 4.0000 mg | ORAL_TABLET | Freq: Three times a day (TID) | ORAL | 0 refills | Status: DC | PRN

## 2017-08-14 NOTE — Progress Notes (Signed)
Patient: Latasha Smith Female    DOB: 1985/04/01   32 y.o.   MRN: [id number] Visit Date: 08/14/2017  Today's Provider: Trinna Post, PA-C   Chief Complaint  Patient presents with  . Abdominal Pain   Subjective:   Pregnancy tested last visit due to abdominal pain, nausea and vomiting. Woke up in the middle of the night nauseated, vomiting in the middle of the night about 2x per week. This has been ongoing for four months total. Has increased to every night. Vomiting, diarrhea, gas pain. Sharp cramping in stomach. Every night at about 3 am, does vary for how long it lasts. Does not seem related to meals. Denies fevers, has chills. Has tried ibuprofen without relief. Dramamine doesn't help. Zantac unknown dose twice without relief. Blood once or twice in stool.   Doesn't follow specific diet - sometimes salad, pizza, has tried to identify trigger. Pizza in particular really does irritate. Eats red meat, though not every day.   Family history: no colon cancer or IBD.  Gallbladder issues - recurrent gallstones in family.  Abdominal Pain  This is a recurrent problem. The current episode started more than 1 month ago (Started about four months ago. ). The problem has been gradually worsening (Especially in the last month.). The pain is located in the epigastric region. The quality of the pain is cramping and burning. Associated symptoms include diarrhea, nausea and vomiting. Pertinent negatives include no anorexia, belching, constipation, fever or frequency. The pain is aggravated by eating (Eating certain foods. ). The treatment provided no relief.    No Known Allergies   Current Outpatient Medications:  .  citalopram (CELEXA) 20 MG tablet, TAKE 1 TABLET BY MOUTH EVERY DAY, Disp: 90 tablet, Rfl: 2 .  ondansetron (ZOFRAN) 4 MG tablet, Take 1 tablet (4 mg total) by mouth every 8 (eight) hours as needed for nausea or vomiting., Disp: 20 tablet, Rfl: 0  Review of Systems    Constitutional: Positive for chills and fatigue. Negative for activity change, appetite change, diaphoresis, fever and unexpected weight change.  Gastrointestinal: Positive for abdominal distention, abdominal pain, diarrhea, nausea and vomiting. Negative for anal bleeding, anorexia, blood in stool, constipation and rectal pain.  Genitourinary: Negative for frequency.    Social History   Tobacco Use  . Smoking status: Never Smoker  . Smokeless tobacco: Never Used  Substance Use Topics  . Alcohol use: Yes    Alcohol/week: 1.8 oz    Types: 3 Glasses of wine per week   Objective:   BP 104/60 (BP Location: Right Arm, Patient Position: Sitting, Cuff Size: Normal)   Pulse 72   Temp 98.2 F (36.8 C) (Oral)   Resp 16   Wt 129 lb (58.5 kg)   BMI 22.14 kg/m  Vitals:   08/14/17 1218  BP: 104/60  Pulse: 72  Resp: 16  Temp: 98.2 F (36.8 C)  TempSrc: Oral  Weight: 129 lb (58.5 kg)     Physical Exam  Constitutional: She is oriented to person, place, and time. She appears well-developed and well-nourished.  Cardiovascular: Normal rate and regular rhythm.  Pulmonary/Chest: Effort normal and breath sounds normal.  Abdominal: Soft. Bowel sounds are normal. She exhibits no distension.  Neurological: She is alert and oriented to person, place, and time.  Skin: Skin is warm and dry.  Psychiatric: She has a normal mood and affect. Her behavior is normal.        Assessment & Plan:  1. Epigastric pain  Labs as below and ultrasound. Omeprazole 20 mg twice daily to cover for GERD/ulcerative disease. Patient requests referral to GI.   - CBC with Differential - Comprehensive Metabolic Panel (CMET) - Lipase - Amylase - C-reactive protein - Sed Rate (ESR) - Tissue transglutaminase, IgA - IgA - US Abdomen Limited RUQ; Future - Ambulatory referral to Gastroenterology  2. Diarrhea, unspecified type  - CBC with Differential - Comprehensive Metabolic Panel (CMET) - Lipase -  Amylase - C-reactive protein - Sed Rate (ESR) - Tissue transglutaminase, IgA - IgA - Ambulatory referral to Gastroenterology  3. Nausea  - ondansetron (ZOFRAN) 4 MG tablet; Take 1 tablet (4 mg total) by mouth every 8 (eight) hours as needed for nausea or vomiting.  Dispense: 20 tablet; Refill: 0  Return if symptoms worsen or fail to improve.  The entirety of the information documented in the History of Present Illness, Review of Systems and Physical Exam were personally obtained by me. Portions of this information were initially documented by Ashley Royalty, CMA and reviewed by me for thoroughness and accuracy.         Trinna Post, PA-C  [facility]

## 2017-08-14 NOTE — Patient Instructions (Signed)
Omeprazole 20 mg twice a day.

## 2017-08-17 ENCOUNTER — Ambulatory Visit
Admission: RE | Admit: 2017-08-17 | Discharge: 2017-08-17 | Disposition: A | Discharge status: Home / Self Care | Payer: BC Managed Care – PPO | Source: Ambulatory Visit | Attending: Physician Assistant | Admitting: Physician Assistant

## 2017-08-17 ENCOUNTER — Telehealth: Payer: Self-pay

## 2017-08-17 DIAGNOSIS — R1013 Epigastric pain: Secondary | ICD-10-CM | POA: Diagnosis present

## 2017-08-17 DIAGNOSIS — R9389 Abnormal findings on diagnostic imaging of other specified body structures: Secondary | ICD-10-CM | POA: Insufficient documentation

## 2017-08-17 NOTE — Telephone Encounter (Signed)
-----   Message from Trinna Post, [place] sent at 08/17/2017  9:57 AM EDT ----- No gallstones. Small polyp in gallbladder. She has been referred to GI and they can assess this further.

## 2017-08-17 NOTE — Telephone Encounter (Signed)
Left message advising pt. OK per DPR. 

## 2017-09-06 ENCOUNTER — Ambulatory Visit: Payer: BC Managed Care – PPO | Admitting: Gastroenterology

## 2017-12-13 DIAGNOSIS — D239 Other benign neoplasm of skin, unspecified: Secondary | ICD-10-CM

## 2017-12-13 HISTORY — DX: Other benign neoplasm of skin, unspecified: D23.9

## 2018-07-04 ENCOUNTER — Telehealth: Payer: Self-pay

## 2018-07-04 NOTE — Telephone Encounter (Signed)
[facility] 

## 2018-08-28 NOTE — Patient Instructions (Addendum)

## 2018-08-28 NOTE — Progress Notes (Signed)
Patient: Latasha Smith Female    DOB: [birth year]   33 y.o.   MRN: [id number] Visit Date: 08/29/2018  Today's Provider: Lavon Paganini, MD   Chief Complaint  Patient presents with  . Headache   Subjective:    I, Porsha McClurkin CMA, am acting as a scribe for Lavon Paganini, MD.   Virtual Visit via Video Note  I connected with Vanesha Athens on 08/29/18 at  8:00 AM EDT by a video enabled telemedicine application and verified that I am speaking with the correct person using two identifiers.   Patient location: home Provider location: [place] involved in the visit: patient, provider   I discussed the limitations of evaluation and management by telemedicine and the availability of in person appointments. The patient expressed understanding and agreed to proceed.   Headache  The current episode started 1 to 4 weeks ago. The problem occurs constantly. The problem has been gradually worsening. The pain is located in the frontal region. The pain does not radiate. The pain quality is similar to prior headaches. The quality of the pain is described as pulsating. The pain is at a severity of 7/10. The pain is moderate. Associated symptoms include blurred vision. The symptoms are aggravated by bright light and noise. She has tried NSAIDs for the symptoms. The treatment provided no relief.    Photophobia and phonophobia.  Now daily.  Taking ibuprofen daily. No aura. Often unilateral. Associated with nausea. No other neuro symptoms, fever, neck stiffness.  No Known Allergies   Current Outpatient Medications:  .  citalopram (CELEXA) 20 MG tablet, TAKE 1 TABLET BY MOUTH EVERY DAY (Patient not taking: Reported on 08/29/2018), Disp: 90 tablet, Rfl: 2 .  ondansetron (ZOFRAN) 4 MG tablet, Take 1 tablet (4 mg total) by mouth every 8 (eight) hours as needed for nausea or vomiting. (Patient not taking: Reported on 08/29/2018), Disp: 20 tablet, Rfl:  0  Review of Systems  Constitutional: Negative.   Eyes: Positive for blurred vision.  Respiratory: Negative.   Genitourinary: Negative.   Neurological: Positive for headaches.    Social History   Tobacco Use  . Smoking status: Never Smoker  . Smokeless tobacco: Never Used  Substance Use Topics  . Alcohol use: Yes    Alcohol/week: 3.0 standard drinks    Types: 3 Glasses of wine per week      Objective:   There were no vitals taken for this visit. There were no vitals filed for this visit.   Physical Exam Constitutional:      General: She is not in acute distress.    Appearance: She is well-developed.  HENT:     Head: Normocephalic and atraumatic.  Pulmonary:     Effort: Pulmonary effort is normal. No respiratory distress.  Neurological:     Mental Status: She is alert and oriented to person, place, and time. Mental status is at baseline.     Cranial Nerves: No facial asymmetry.     Gait: Gait normal.  Psychiatric:        Mood and Affect: Mood normal.        Speech: Speech normal.        Behavior: Behavior normal.      No results found for any visits on 08/29/18.     Assessment & Plan    I discussed the assessment and treatment plan with the patient. The patient was provided an opportunity to ask questions and all were  answered. The patient agreed with the plan and demonstrated an understanding of the instructions.   The patient was advised to call back or seek an in-person evaluation if the symptoms worsen or if the condition fails to improve as anticipated.  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 Now having daily migraines which are incapacitating Discussed medication overuse headache which may be contributing as well as below She can use sumatriptan at the first sign of a migraine Discussed no more than 2 doses per day or 3 doses per week of triptan Phenergan as needed  for migraine abortion/nausea NSAIDs okay to use periodically, but would not use daily Start prophylaxis that she is having very frequent migraines We discussed options and decided to start amitriptyline 25 mg nightly Discussed that this can be somewhat sedating At follow-up, consider dose titration      Relevant Medications   SUMAtriptan (IMITREX) 50 MG tablet   amitriptyline (ELAVIL) 25 MG tablet     Other   Medication overuse headache    Patient's primary headache symptoms are consistent with chronic migraines, we discussed that analgesic rebound headaches may be contributing Discussed cutting out NSAIDs and cutting back on caffeine Medrol Dosepak to help taper her off NSAIDs and decrease rebound headaches Migraine prophylaxis as above      Relevant Medications   SUMAtriptan (IMITREX) 50 MG tablet   amitriptyline (ELAVIL) 25 MG tablet       Return in about 3 months (around 11/29/2018) for migraine f/u.   The entirety of the information documented in the History of Present Illness, Review of Systems and Physical Exam were personally obtained by me. Portions of this information were initially documented by [facility], CMA and reviewed by me for thoroughness and accuracy.    Camrin Gearheart, Dionne Bucy, MD MPH [facility]

## 2018-08-29 ENCOUNTER — Ambulatory Visit (INDEPENDENT_AMBULATORY_CARE_PROVIDER_SITE_OTHER): Payer: BC Managed Care – PPO | Admitting: Family Medicine

## 2018-08-29 DIAGNOSIS — G4440 Drug-induced headache, not elsewhere classified, not intractable: Secondary | ICD-10-CM | POA: Diagnosis not present

## 2018-08-29 DIAGNOSIS — G43709 Chronic migraine without aura, not intractable, without status migrainosus: Secondary | ICD-10-CM

## 2018-08-29 MED ORDER — METHYLPREDNISOLONE 4 MG PO TBPK: ORAL_TABLET | ORAL | 0 refills | Status: DC

## 2018-08-29 MED ORDER — SUMATRIPTAN SUCCINATE 50 MG PO TABS: 50.0000 mg | ORAL_TABLET | Freq: Every day | ORAL | 0 refills | Status: DC | PRN

## 2018-08-29 MED ORDER — AMITRIPTYLINE HCL 25 MG PO TABS: 25.0000 mg | ORAL_TABLET | Freq: Every day | ORAL | 3 refills | Status: DC

## 2018-08-29 MED ORDER — PROMETHAZINE HCL 25 MG PO TABS: 25.0000 mg | ORAL_TABLET | Freq: Three times a day (TID) | ORAL | 1 refills | Status: DC | PRN

## 2018-08-29 NOTE — Assessment & Plan Note (Signed)
Patient's primary headache symptoms are consistent with chronic migraines, we discussed that analgesic rebound headaches may be contributing Discussed cutting out NSAIDs and cutting back on caffeine Medrol Dosepak to help taper her off NSAIDs and decrease rebound headaches Migraine prophylaxis as above

## 2018-08-29 NOTE — Assessment & Plan Note (Signed)
Chronic without aura Now having daily migraines which are incapacitating Discussed medication overuse headache which may be contributing as well as below She can use sumatriptan at the first sign of a migraine Discussed no more than 2 doses per day or 3 doses per week of triptan Phenergan as needed for migraine abortion/nausea NSAIDs okay to use periodically, but would not use daily Start prophylaxis that she is having very frequent migraines We discussed options and decided to start amitriptyline 25 mg nightly Discussed that this can be somewhat sedating At follow-up, consider dose titration

## 2018-09-20 ENCOUNTER — Other Ambulatory Visit: Payer: Self-pay | Admitting: Family Medicine

## 2018-12-02 IMAGING — US US ABDOMEN LIMITED
1 series · 14 of 25 positions shown · non-contrast
Comparison: No recent prior.

CLINICAL DATA: Epigastric pain.  Right upper quadrant pain.

EXAM:
ULTRASOUND ABDOMEN LIMITED RIGHT UPPER QUADRANT

[Series 1: us abdomen limited · 0.20mm/px · 14 of 79 slices shown]
[im 1/79]
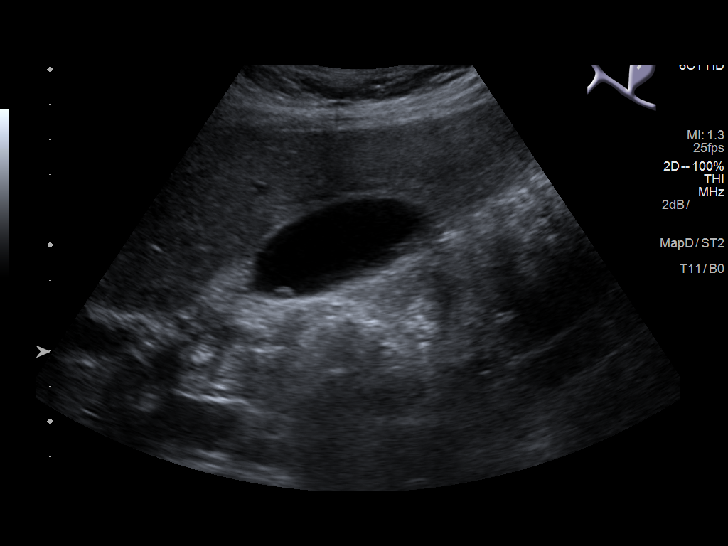
[im 7/79]
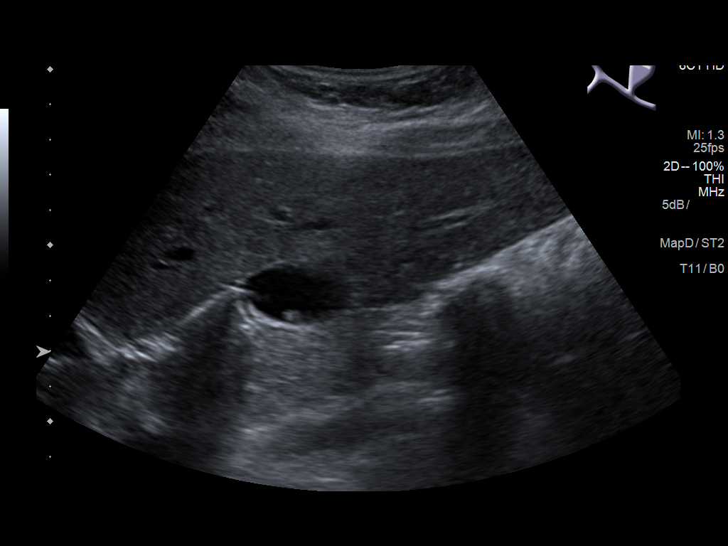
[im 14/79]
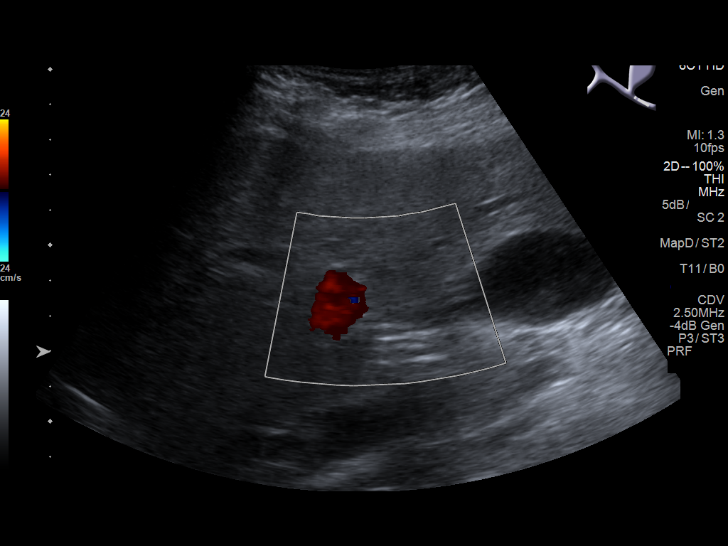
[im 20/79]
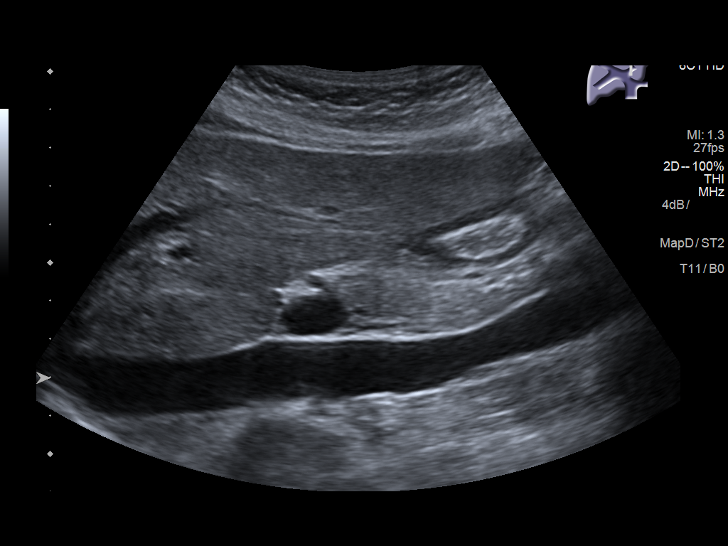
[im 27/79]
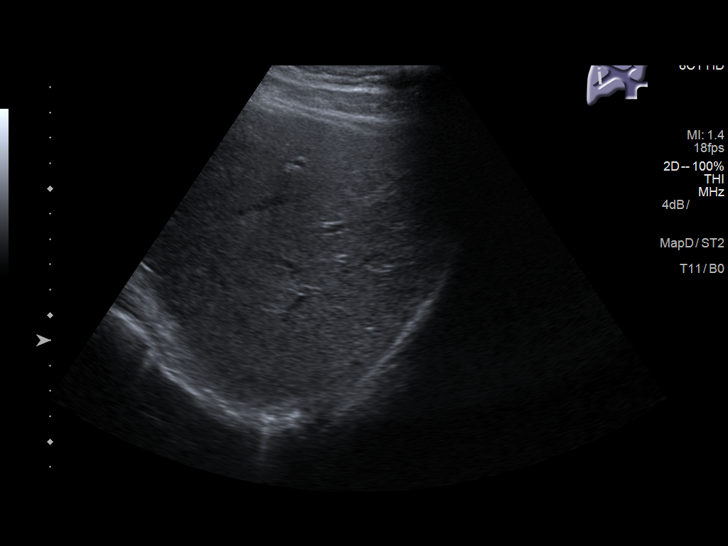
[im 30/79]
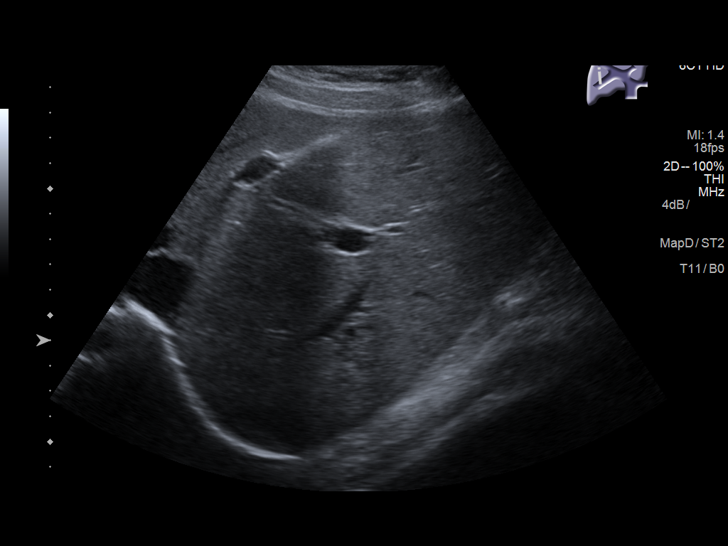
[im 36/79]
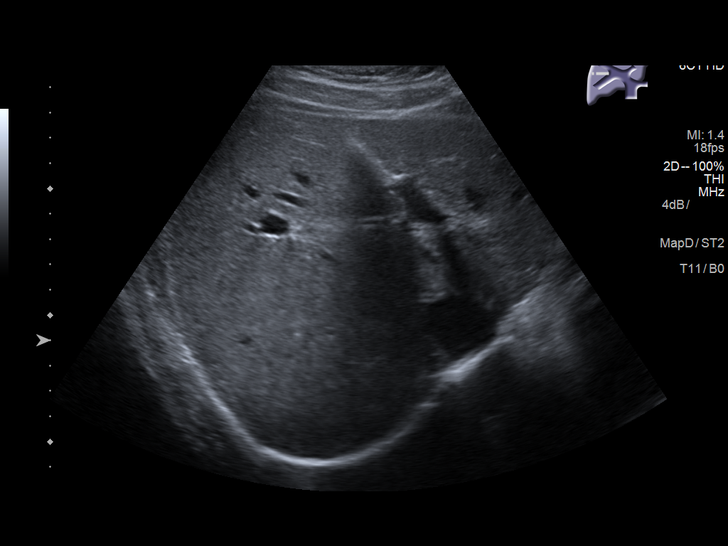
[im 43/79]
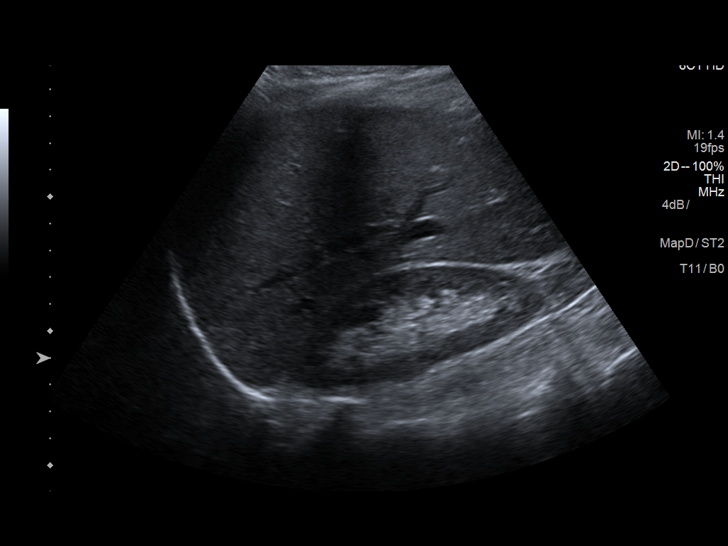
[im 49/79]
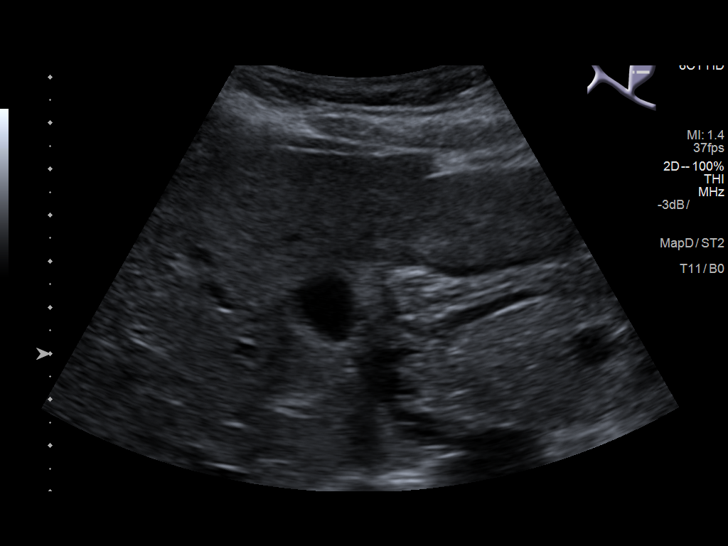
[im 53/79]
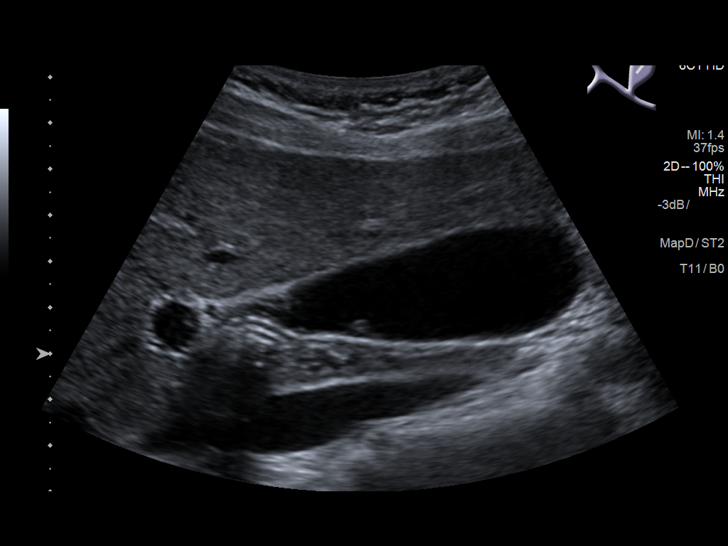
[im 59/79]
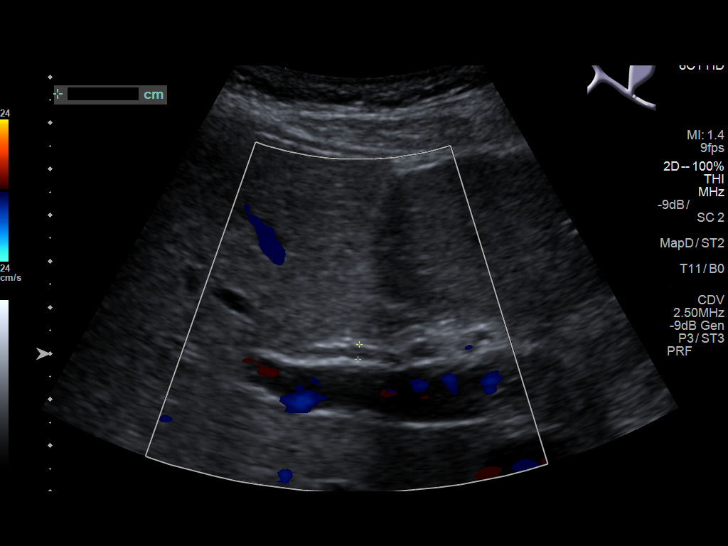
[im 66/79]
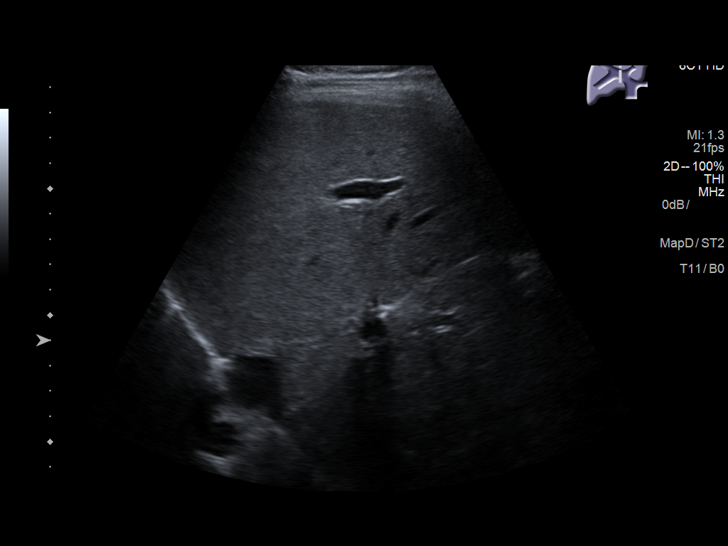
[im 72/79]
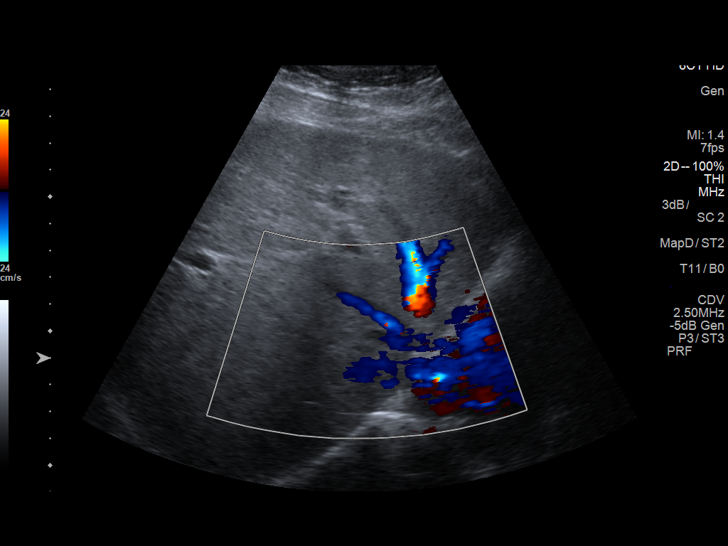
[im 79/79]
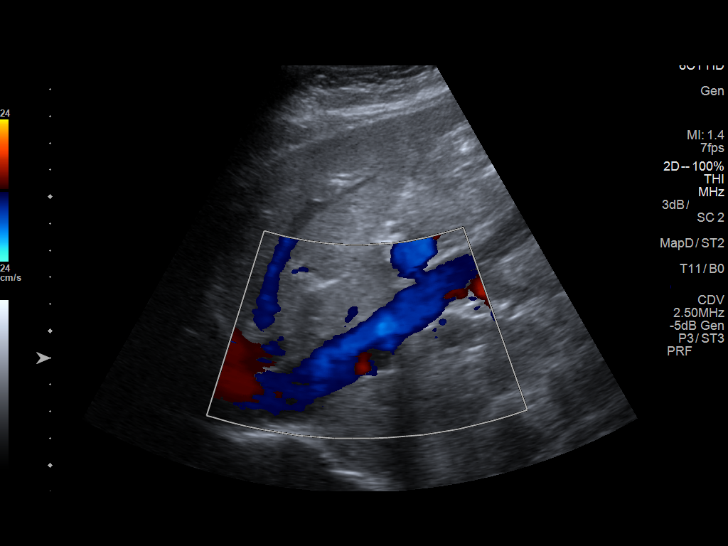

[14 of 25 positions shown; findings below may reference images not displayed]

FINDINGS: Gallbladder:

4 mm nonshadowing, non mobile echodensity noted within the
gallbladder. This is most likely a tiny polyp. No gallstones or
biliary distention.

Common bile duct:

Diameter: 3.3 mm

Liver:

No focal lesion identified. Within normal limits in parenchymal
echogenicity. Portal vein is patent on color Doppler imaging with
normal direction of blood flow towards the liver.
IMPRESSION: 4 mm nonshadowing, non mobile echodensity noted within the
gallbladder. This is most likely a tiny polyp. No gallstones noted.
No evidence of cholecystitis. No biliary distention.

## 2018-12-16 ENCOUNTER — Other Ambulatory Visit: Payer: Self-pay | Admitting: Physician Assistant

## 2019-09-15 NOTE — Progress Notes (Deleted)
     Established patient visit   Patient: Latasha Smith   DOB: 03-19-1985   34 y.o. Female  MRN: [id number] Visit Date: 09/16/2019  Today's healthcare provider: Trinna Post, PA-C   No chief complaint on file.  Subjective    HPI  ***  {Show patient history (optional):23778::" "}   Medications: Outpatient Medications Prior to Visit  Medication Sig  . amitriptyline (ELAVIL) 25 MG tablet TAKE 1 TABLET BY MOUTH EVERYDAY AT BEDTIME  . methylPREDNISolone (MEDROL DOSEPAK) 4 MG TBPK tablet Take 6 tabs PO daily x1 day, then take 5 tabs PO daily x1d, then 4 tabs PO daily x1d, then 3 tabs PO daily x1d, then 2 tabs PO daily x1d, then 1 tab PO daily x1d , then stop  . promethazine (PHENERGAN) 25 MG tablet Take 1 tablet (25 mg total) by mouth every 8 (eight) hours as needed for nausea or vomiting.  . SUMAtriptan (IMITREX) 50 MG tablet Take 1 tablet (50 mg total) by mouth daily as needed for migraine. May repeat in 2 hours if headache persists or recurs.   No facility-administered medications prior to visit.    Review of Systems  {Heme  Chem  Endocrine  Serology  Results Review (optional):23779::" "}  Objective    There were no vitals taken for this visit. {Show previous vital signs (optional):23777::" "}  Physical Exam  ***  No results found for any visits on 09/16/19.  Assessment & Plan     ***  No follow-ups on file.      {provider attestation***:1}   Paulene Floor  [facility] [phone] (phone) [phone] (fax)  [place]

## 2019-09-16 ENCOUNTER — Ambulatory Visit: Payer: BC Managed Care – PPO | Admitting: Physician Assistant

## 2019-11-06 ENCOUNTER — Ambulatory Visit: Payer: BC Managed Care – PPO | Admitting: Family Medicine

## 2019-11-06 ENCOUNTER — Encounter: Payer: Self-pay | Admitting: Family Medicine

## 2019-11-06 ENCOUNTER — Other Ambulatory Visit: Payer: Self-pay

## 2019-11-06 VITALS — BP 111/71 | HR 60 | Temp 98.5°F | Ht 64.0 in | Wt 129.0 lb

## 2019-11-06 DIAGNOSIS — F988 Other specified behavioral and emotional disorders with onset usually occurring in childhood and adolescence: Secondary | ICD-10-CM | POA: Diagnosis not present

## 2019-11-06 MED ORDER — AMPHETAMINE-DEXTROAMPHETAMINE 10 MG PO TABS: 10.0000 mg | ORAL_TABLET | Freq: Two times a day (BID) | ORAL | 0 refills | Status: DC

## 2019-11-06 NOTE — Progress Notes (Signed)
Established patient visit   Patient: Latasha Smith   DOB: 03/26/1985   34 y.o. Female  MRN: [id number] Visit Date: 11/06/2019  Today's healthcare provider: Lavon Paganini, MD   Chief Complaint  Patient presents with   ADHD   Subjective    HPI  Ms. Cech presents today for concerns of difficulty concentrating. 2 years ago she noticed that she was having difficulty focusing on things, thinking about all of the things she has to do. For the past 6 months or so, she is forgetting more and more. She has several family members with ADD who told her she might have this and to get checked out. She has 3 boys under 43 years old and a full time job.  She has challenges focusing on anything, including tasks and people talking. She has difficulty finishing tasks at home and switching around tasks at work. She feels this is negatively affecting her job. She also reports headaches, muscle tension, irritability, increased sensitivity to overstimulation which can cause her to feel light-headed, feeling panicky, some "doomsday spiraling", feeling overwhelmed and stressed, and occasional chest pain and palpitations.    She does not remember any difficulties in high school. She first noticed some issues when she had her twins and went back to work; she was treated for depression at that time but it didn't seem to help.      Medications: Outpatient Medications Prior to Visit  Medication Sig   SUMAtriptan (IMITREX) 50 MG tablet Take 1 tablet (50 mg total) by mouth daily as needed for migraine. May repeat in 2 hours if headache persists or recurs.   [DISCONTINUED] amitriptyline (ELAVIL) 25 MG tablet TAKE 1 TABLET BY MOUTH EVERYDAY AT BEDTIME   [DISCONTINUED] methylPREDNISolone (MEDROL DOSEPAK) 4 MG TBPK tablet Take 6 tabs PO daily x1 day, then take 5 tabs PO daily x1d, then 4 tabs PO daily x1d, then 3 tabs PO daily x1d, then 2 tabs PO daily x1d, then 1 tab PO daily x1d , then stop    [DISCONTINUED] promethazine (PHENERGAN) 25 MG tablet Take 1 tablet (25 mg total) by mouth every 8 (eight) hours as needed for nausea or vomiting.   No facility-administered medications prior to visit.   Currently not on any of these medications (has not been on them for over a year) Review of Systems  Cardiovascular: Positive for chest pain and palpitations.  Gastrointestinal: Negative for nausea and vomiting.  Endocrine: Negative for cold intolerance and heat intolerance.  Neurological: Positive for light-headedness and headaches. Negative for syncope and numbness.   Light-headed when a lot is going on, once-month Minor chest pain when panicky  Last CBC Lab Results  Component Value Date   WBC 6.3 04/02/2017   HGB 13.2 04/02/2017   HCT 39.8 04/02/2017   MCV 89 04/02/2017   MCH 29.5 04/02/2017   RDW 13.8 04/02/2017   PLT 246 22/97/9892   Last metabolic panel Lab Results  Component Value Date   GLUCOSE 79 04/02/2017   NA 141 04/02/2017   K 3.9 04/02/2017   CL 104 04/02/2017   CO2 23 04/02/2017   BUN 16 04/02/2017   CREATININE 0.77 04/02/2017   GFRNONAA 105 04/02/2017   GFRAA 121 04/02/2017   CALCIUM 9.4 04/02/2017   PROT 6.9 04/02/2017   ALBUMIN 4.6 04/02/2017   LABGLOB 2.3 04/02/2017   AGRATIO 2.0 04/02/2017   BILITOT 0.4 04/02/2017   ALKPHOS 41 04/02/2017   AST 13 04/02/2017   ALT 12 04/02/2017  ANIONGAP 8 07/14/2013   Last thyroid functions Lab Results  Component Value Date   TSH 1.970 04/02/2017      Objective    BP 111/71 (BP Location: Left Arm, Patient Position: Sitting, Cuff Size: Normal)    Pulse 60    Temp 98.5 F (36.9 C) (Oral)    Ht 5\' 4"  (1.626 m)    Wt 129 lb (58.5 kg)    BMI 22.14 kg/m     Physical Exam Vitals reviewed.  Constitutional:      General: She is not in acute distress.    Appearance: Normal appearance. She is normal weight. She is not ill-appearing, toxic-appearing or diaphoretic.  HENT:     Head: Normocephalic.  Eyes:      Extraocular Movements: Extraocular movements intact.  Cardiovascular:     Rate and Rhythm: Normal rate and regular rhythm.     Heart sounds: Normal heart sounds. No murmur heard.  No friction rub. No gallop.   Pulmonary:     Effort: Pulmonary effort is normal. No respiratory distress.     Breath sounds: Normal breath sounds. No stridor. No wheezing.  Musculoskeletal:     Cervical back: Normal range of motion and neck supple. No rigidity or tenderness.  Lymphadenopathy:     Cervical: No cervical adenopathy.  Skin:    General: Skin is warm and dry.  Neurological:     General: No focal deficit present.     Mental Status: She is alert and oriented to person, place, and time. Mental status is at baseline.  Psychiatric:        Mood and Affect: Mood normal.        Behavior: Behavior normal.        Thought Content: Thought content normal.        Judgment: Judgment normal.      No results found for any visits on 11/06/19.  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      Rodrigo Ran, MS3  Patient seen along with MS3 student Rodrigo Ran. I personally evaluated this patient along with the student, and verified all aspects of the history, physical exam, and medical decision making as  documented by the student. I agree with the student's documentation and have made all necessary edits.  Luismario Coston, Dionne Bucy, MD, MPH [facility]

## 2019-11-06 NOTE — Patient Instructions (Signed)
Amphetamine; Dextroamphetamine tablets What is this medicine? AMPHETAMINE; DEXTROAMPHETAMINE(am FET a meen; dex troe am FET a meen) is used to treat attention-deficit hyperactivity disorder (ADHD). It may also be used for narcolepsy. Federal law prohibits giving this medicine to any person other than the person for whom it was prescribed. Do not share this medicine with anyone else. This medicine may be used for other purposes; ask your health care provider or pharmacist if you have questions. COMMON BRAND NAME(S): Adderall What should I tell my health care provider before I take this medicine? They need to know if you have any of these conditions:  anxiety or panic attacks  circulation problems in fingers and toes  glaucoma  hardening or blockages of the arteries or heart blood vessels  heart disease or a heart defect  high blood pressure  history of a drug or alcohol abuse problem  history of stroke  kidney disease  liver disease  mental illness  seizures  suicidal thoughts, plans, or attempt; a previous suicide attempt by you or a family member  thyroid disease  Tourette's syndrome  an unusual or allergic reaction to dextroamphetamine, other amphetamines, other medicines, foods, dyes, or preservatives  pregnant or trying to get pregnant  breast-feeding How should I use this medicine? Take this medicine by mouth with a glass of water. Follow the directions on the prescription label. Take your doses at regular intervals. Do not take your medicine more often than directed. Do not suddenly stop your medicine. You must gradually reduce the dose or you may feel withdrawal effects. Ask your doctor or health care professional for advice. Talk to your pediatrician regarding the use of this medicine in children. Special care may be needed. While this drug may be prescribed for children as young as 3 years for selected conditions, precautions do apply. Overdosage: If you think  you have taken too much of this medicine contact a poison control center or emergency room at once. NOTE: This medicine is only for you. Do not share this medicine with others. What if I miss a dose? If you miss a dose, take it as soon as you can. If it is almost time for your next dose, take only that dose. Do not take double or extra doses. What may interact with this medicine? Do not take this medicine with any of the following medications:  MAOIs like Carbex, Eldepryl, Marplan, Nardil, and Parnate  other stimulant medicines for attention disorders This medicine may also interact with the following medications:  acetazolamide  ammonium chloride  antacids  ascorbic acid  atomoxetine  caffeine  certain medicines for blood pressure  certain medicines for depression, anxiety, or psychotic disturbances  certain medicines for seizures like carbamazepine, phenobarbital, phenytoin  certain medicines for stomach problems like cimetidine, ranitidine, famotidine, esomeprazole, omeprazole, lansoprazole, pantoprazole  lithium  medicines for colds and breathing difficulties  medicines for diabetes  medicines or dietary supplements for weight loss or to stay awake  methenamine  narcotic medicines for pain  quinidine  ritonavir  sodium bicarbonate  St. John's wort This list may not describe all possible interactions. Give your health care provider a list of all the medicines, herbs, non-prescription drugs, or dietary supplements you use. Also tell them if you smoke, drink alcohol, or use illegal drugs. Some items may interact with your medicine. What should I watch for while using this medicine? Visit your doctor or health care professional for regular checks on your progress. This prescription requires that you follow  special procedures with your doctor and pharmacy. You will need to have a new written prescription from your doctor every time you need a refill. This medicine  may affect your concentration, or hide signs of tiredness. Until you know how this medicine affects you, do not drive, ride a bicycle, use machinery, or do anything that needs mental alertness. Tell your doctor or health care professional if this medicine loses its effects, or if you feel you need to take more than the prescribed amount. Do not change the dosage without talking to your doctor or health care professional. Decreased appetite is a common side effect when starting this medicine. Eating small, frequent meals or snacks can help. Talk to your doctor if you continue to have poor eating habits. Height and weight growth of a child taking this medicine will be monitored closely. Do not take this medicine close to bedtime. It may prevent you from sleeping. If you are going to need surgery, a MRI, CT scan, or other procedure, tell your doctor that you are taking this medicine. You may need to stop taking this medicine before the procedure. Tell your doctor or healthcare professional right away if you notice unexplained wounds on your fingers and toes while taking this medicine. You should also tell your healthcare provider if you experience numbness or pain, changes in the skin color, or sensitivity to temperature in your fingers or toes. What side effects may I notice from receiving this medicine? Side effects that you should report to your doctor or health care professional as soon as possible:  allergic reactions like skin rash, itching or hives, swelling of the face, lips, or tongue  anxious  breathing problems  changes in emotions or moods  changes in vision  chest pain or chest tightness  fast, irregular heartbeat  fingers or toes feel numb, cool, painful  hallucination, loss of contact with reality  high blood pressure  males: prolonged or painful erection  seizures  signs and symptoms of serotonin syndrome like confusion, increased sweating, fever, tremor, stiff muscles,  diarrhea  signs and symptoms of a stroke like changes in vision; confusion; trouble speaking or understanding; severe headaches; sudden numbness or weakness of the face, arm or leg; trouble walking; dizziness; loss of balance or coordination  suicidal thoughts or other mood changes  uncontrollable head, mouth, neck, arm, or leg movements Side effects that usually do not require medical attention (report to your doctor or health care professional if they continue or are bothersome):  dry mouth  headache  irritability  loss of appetite  nausea  trouble sleeping  weight loss This list may not describe all possible side effects. Call your doctor for medical advice about side effects. You may report side effects to FDA at 1-800-FDA-1088. Where should I keep my medicine? Keep out of the reach of children. This medicine can be abused. Keep your medicine in a safe place to protect it from theft. Do not share this medicine with anyone. Selling or giving away this medicine is dangerous and against the law. Store at room temperature between 15 and 30 degrees C (59 and 86 degrees F). Keep container tightly closed. Throw away any unused medicine after the expiration date. Dispose of properly. This medicine may cause accidental overdose and death if it is taken by other adults, children, or pets. Mix any unused medicine with a substance like cat litter or coffee grounds. Then throw the medicine away in a sealed container like a sealed bag or  a coffee can with a lid. Do not use the medicine after the expiration date. NOTE: This sheet is a summary. It may not cover all possible information. If you have questions about this medicine, talk to your doctor, pharmacist, or health care provider.  2020 Elsevier/Gold Standard (2016-03-24 85:92:92)

## 2019-11-06 NOTE — Assessment & Plan Note (Addendum)
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

## 2019-11-07 ENCOUNTER — Encounter: Payer: Self-pay | Admitting: Family Medicine

## 2019-11-10 ENCOUNTER — Telehealth: Payer: Self-pay

## 2019-11-10 NOTE — Telephone Encounter (Signed)
Copied from [place] [id number]. Topic: General - Other >> Nov 10, 2019 10:59 AM Leward Quan A wrote: Reason for CRM: Patient called to inform Dr B that the pharmacy say that for insurance approval on her Rx for amphetamine-dextroamphetamine (ADDERALL) 10 MG tablet sent over on 11/06/19 the insurance company need more information from the doctor. Patient is asking for some help so that she can get her medication please call patient at Ph#  [phone]

## 2019-11-11 NOTE — Telephone Encounter (Signed)
I haven't seen a PA or other form from pharmacy. Have you?  Maybe we can call the pharmacy and find out what they need

## 2019-11-17 ENCOUNTER — Other Ambulatory Visit: Payer: Self-pay

## 2019-11-17 MED ORDER — EPIDUO FORTE 0.3-2.5 % EX GEL: 1.0000 "application " | Freq: Every day | CUTANEOUS | 0 refills | Status: DC

## 2019-11-19 ENCOUNTER — Telehealth: Payer: Self-pay

## 2019-11-19 NOTE — Telephone Encounter (Signed)
Prescription was denied by insurance. Informed pt that she would need to make appt for a new prescription.

## 2019-12-15 ENCOUNTER — Telehealth: Payer: BC Managed Care – PPO | Admitting: Family Medicine

## 2019-12-16 ENCOUNTER — Other Ambulatory Visit: Payer: Self-pay

## 2019-12-16 MED ORDER — EPIDUO FORTE 0.3-2.5 % EX GEL: 1.0000 "application " | Freq: Every day | CUTANEOUS | 0 refills | Status: DC

## 2019-12-16 NOTE — Progress Notes (Signed)
RF of Epiduo Forte until patient comes in 01/14/20 to see Dr. Nehemiah Massed for a follow up.

## 2020-01-21 ENCOUNTER — Ambulatory Visit: Payer: BC Managed Care – PPO | Admitting: Dermatology

## 2020-02-11 ENCOUNTER — Telehealth: Payer: BC Managed Care – PPO | Admitting: Adult Health

## 2020-02-11 ENCOUNTER — Telehealth (INDEPENDENT_AMBULATORY_CARE_PROVIDER_SITE_OTHER): Payer: BC Managed Care – PPO | Admitting: Adult Health

## 2020-02-11 ENCOUNTER — Encounter: Payer: Self-pay | Admitting: Adult Health

## 2020-02-11 DIAGNOSIS — J0140 Acute pansinusitis, unspecified: Secondary | ICD-10-CM | POA: Insufficient documentation

## 2020-02-11 MED ORDER — AMOXICILLIN-POT CLAVULANATE 875-125 MG PO TABS: 1.0000 | ORAL_TABLET | Freq: Two times a day (BID) | ORAL | 0 refills | Status: DC

## 2020-02-11 MED ORDER — PREDNISONE 10 MG (21) PO TBPK: ORAL_TABLET | ORAL | 0 refills | Status: DC

## 2020-02-11 NOTE — Progress Notes (Signed)
MyChart Video Visit    Virtual Visit via Video Note   This visit type was conducted due to national recommendations for restrictions regarding the COVID-19 Pandemic (e.g. social distancing) in an effort to limit this patient's exposure and mitigate transmission in our community. This patient is at least at moderate risk for complications without adequate follow up. This format is felt to be most appropriate for this patient at this time. Physical exam was limited by quality of the video and audio technology used for the visit.  Parties involved in visit as below:   Patient location: at home.  Provider location: Provider: Provider's office at  [facility], [place].     I discussed the limitations of evaluation and management by telemedicine and the availability of in person appointments. The patient expressed understanding and agreed to proceed.  Patient: Latasha Smith   DOB: 05/31/1985   34 y.o. Female  MRN: [id number] Visit Date: 02/11/2020  Today's healthcare provider: Jairo BenMichelle Smith Yusuke Beza, FNP   Chief Complaint  Patient presents with  . URI   Subjective    URI  This is a new problem. The current episode started 1 to 4 weeks ago. The problem has been gradually worsening. The maximum temperature recorded prior to her arrival was 101 - 101.9 F. Associated symptoms include congestion, coughing, ear pain, neck pain, a plugged ear sensation, rhinorrhea, sinus pain, sneezing, a sore throat and swollen glands. Pertinent negatives include no abdominal pain, chest pain, diarrhea, dysuria, joint pain, joint swelling, nausea, rash, vomiting or wheezing. She has tried NSAIDs for the symptoms. The treatment provided moderate relief.    Bright green nasal discharge mixed with blood.   Tested negative for strep, covid.   Patient  denies any fever, body aches,chills, rash, chest pain, shortness of breath, nausea, vomiting, or diarrhea.  Denies dizziness,  lightheadedness, pre syncopal or syncopal episodes.  Denies any chance of pregnancy.    Lmp 02/07/20.    Medications: Outpatient Medications Prior to Visit  Medication Sig  . Adapalene-Benzoyl Peroxide (EPIDUO FORTE) 0.3-2.5 % GEL Apply 1 application topically at bedtime. Pt needs appt  . amphetamine-dextroamphetamine (ADDERALL) 10 MG tablet Take 1 tablet (10 mg total) by mouth 2 (two) times daily.  . SUMAtriptan (IMITREX) 50 MG tablet Take 1 tablet (50 mg total) by mouth daily as needed for migraine. May repeat in 2 hours if headache persists or recurs.   No facility-administered medications prior to visit.    Review of Systems  Constitutional: Positive for fatigue and fever. Negative for activity change, appetite change, chills, diaphoresis and unexpected weight change.  HENT: Positive for congestion, ear pain, rhinorrhea, sinus pain, sneezing and sore throat. Negative for dental problem, drooling and ear discharge.   Respiratory: Positive for cough. Negative for apnea, choking, chest tightness, shortness of breath, wheezing and stridor.   Cardiovascular: Negative for chest pain, palpitations and leg swelling.  Gastrointestinal: Negative for abdominal pain, diarrhea, nausea and vomiting.  Genitourinary: Negative for dysuria.  Musculoskeletal: Positive for neck pain. Negative for joint pain.  Skin: Negative for rash.      Objective    There were no vitals taken for this visit.   Physical Exam    Patient is alert and oriented and responsive to questions Engages in conversation with provider. Speaks in full sentences without any pauses without any shortness of breath or distress.   Assessment & Plan      Acute non-recurrent pansinusitis   Meds ordered this  encounter  Medications  . amoxicillin-clavulanate (AUGMENTIN) 875-125 MG tablet    Sig: Take 1 tablet by mouth 2 (two) times daily.    Dispense:  20 tablet    Refill:  0  . predniSONE (STERAPRED UNI-PAK 21 TAB) 10  MG (21) TBPK tablet    Sig: PO: Take 6 tablets on day 1:Take 5 tablets day 2:Take 4 tablets day 3: Take 3 tablets day 4:Take 2 tablets day five: 5 Take 1 tablet day 6    Dispense:  21 tablet    Refill:  0   Recommend follow up with ENT if not resolved completely.   Return in about 3 days (around 02/14/2020), or if symptoms worsen or fail to improve, for at any time for any worsening symptoms, Go to Emergency room/ urgent care if worse.     I discussed the assessment and treatment plan with the patient. The patient was provided an opportunity to ask questions and all were answered. The patient agreed with the plan and demonstrated an understanding of the instructions.   The patient was advised to call back or seek an in-person evaluation if the symptoms worsen or if the condition fails to improve as anticipated.  I provided 23 minutes of non-face-to-face time during this encounter.  The entirety of the information documented in the History of Present Illness, Review of Systems and Physical Exam were personally obtained by me. Portions of this information were initially documented by the CMA and reviewed by me for thoroughness and accuracy.   Red Flags discussed. The patient was given clear instructions to go to ER or return to medical center if any red flags develop, symptoms do not improve, worsen or new problems develop. They verbalized understanding.   Jairo Ben, FNP [facility] [phone] (phone) [phone] (fax)  [facility]

## 2020-02-13 NOTE — Patient Instructions (Signed)
Sinusitis, Adult Sinusitis is inflammation of your sinuses. Sinuses are hollow spaces in the bones around your face. Your sinuses are located:  Around your eyes.  In the middle of your forehead.  Behind your nose.  In your cheekbones. Mucus normally drains out of your sinuses. When your nasal tissues become inflamed or swollen, mucus can become trapped or blocked. This allows bacteria, viruses, and fungi to grow, which leads to infection. Most infections of the sinuses are caused by a virus. Sinusitis can develop quickly. It can last for up to 4 weeks (acute) or for more than 12 weeks (chronic). Sinusitis often develops after a cold. What are the causes? This condition is caused by anything that creates swelling in the sinuses or stops mucus from draining. This includes:  Allergies.  Asthma.  Infection from bacteria or viruses.  Deformities or blockages in your nose or sinuses.  Abnormal growths in the nose (nasal polyps).  Pollutants, such as chemicals or irritants in the air.  Infection from fungi (rare). What increases the risk? You are more likely to develop this condition if you:  Have a weak body defense system (immune system).  Do a lot of swimming or diving.  Overuse nasal sprays.  Smoke. What are the signs or symptoms? The main symptoms of this condition are pain and a feeling of pressure around the affected sinuses. Other symptoms include:  Stuffy nose or congestion.  Thick drainage from your nose.  Swelling and warmth over the affected sinuses.  Headache.  Upper toothache.  A cough that may get worse at night.  Extra mucus that collects in the throat or the back of the nose (postnasal drip).  Decreased sense of smell and taste.  Fatigue.  A fever.  Sore throat.  Bad breath. How is this diagnosed? This condition is diagnosed based on:  Your symptoms.  Your medical history.  A physical exam.  Tests to find out if your condition is  acute or chronic. This may include: ? Checking your nose for nasal polyps. ? Viewing your sinuses using a device that has a light (endoscope). ? Testing for allergies or bacteria. ? Imaging tests, such as an MRI or CT scan. In rare cases, a bone biopsy may be done to rule out more serious types of fungal sinus disease. How is this treated? Treatment for sinusitis depends on the cause and whether your condition is chronic or acute.  If caused by a virus, your symptoms should go away on their own within 10 days. You may be given medicines to relieve symptoms. They include: ? Medicines that shrink swollen nasal passages (topical intranasal decongestants). ? Medicines that treat allergies (antihistamines). ? A spray that eases inflammation of the nostrils (topical intranasal corticosteroids). ? Rinses that help get rid of thick mucus in your nose (nasal saline washes).  If caused by bacteria, your health care provider may recommend waiting to see if your symptoms improve. Most bacterial infections will get better without antibiotic medicine. You may be given antibiotics if you have: ? A severe infection. ? A weak immune system.  If caused by narrow nasal passages or nasal polyps, you may need to have surgery. Follow these instructions at home: Medicines  Take, use, or apply over-the-counter and prescription medicines only as told by your health care provider. These may include nasal sprays.  If you were prescribed an antibiotic medicine, take it as told by your health care provider. Do not stop taking the antibiotic even if you start   to feel better. Hydrate and humidify   Drink enough fluid to keep your urine pale yellow. Staying hydrated will help to thin your mucus.  Use a cool mist humidifier to keep the humidity level in your home above 50%.  Inhale steam for 10-15 minutes, 3-4 times a day, or as told by your health care provider. You can do this in the bathroom while a hot shower is  running.  Limit your exposure to cool or dry air. Rest  Rest as much as possible.  Sleep with your head raised (elevated).  Make sure you get enough sleep each night. General instructions   Apply a warm, moist washcloth to your face 3-4 times a day or as told by your health care provider. This will help with discomfort.  Wash your hands often with soap and water to reduce your exposure to germs. If soap and water are not available, use hand sanitizer.  Do not smoke. Avoid being around people who are smoking (secondhand smoke).  Keep all follow-up visits as told by your health care provider. This is important. Contact a health care provider if:  You have a fever.  Your symptoms get worse.  Your symptoms do not improve within 10 days. Get help right away if:  You have a severe headache.  You have persistent vomiting.  You have severe pain or swelling around your face or eyes.  You have vision problems.  You develop confusion.  Your neck is stiff.  You have trouble breathing. Summary  Sinusitis is soreness and inflammation of your sinuses. Sinuses are hollow spaces in the bones around your face.  This condition is caused by nasal tissues that become inflamed or swollen. The swelling traps or blocks the flow of mucus. This allows bacteria, viruses, and fungi to grow, which leads to infection.  If you were prescribed an antibiotic medicine, take it as told by your health care provider. Do not stop taking the antibiotic even if you start to feel better.  Keep all follow-up visits as told by your health care provider. This is important. This information is not intended to replace advice given to you by your health care provider. Make sure you discuss any questions you have with your health care provider. Document Revised: 07/02/2017 Document Reviewed: 07/02/2017 Elsevier Patient Education  2020 [employer]vier Inc.  

## 2020-04-13 ENCOUNTER — Other Ambulatory Visit: Payer: Self-pay | Admitting: Dermatology

## 2020-04-28 ENCOUNTER — Other Ambulatory Visit: Payer: Self-pay

## 2020-04-28 MED ORDER — ADAPALENE-BENZOYL PEROXIDE 0.3-2.5 % EX GEL: 1.0000 "application " | Freq: Every day | CUTANEOUS | 0 refills | Status: DC

## 2020-04-28 NOTE — Progress Notes (Signed)
Epiduo RF. Patients appointment cancelled due to Dr. Nehemiah Massed being out of office. Scheduled in July.

## 2020-05-03 ENCOUNTER — Ambulatory Visit: Payer: BC Managed Care – PPO | Admitting: Dermatology

## 2020-06-22 ENCOUNTER — Other Ambulatory Visit: Payer: Self-pay | Admitting: Dermatology

## 2020-07-26 ENCOUNTER — Encounter: Payer: Self-pay | Admitting: Family Medicine

## 2020-07-26 ENCOUNTER — Other Ambulatory Visit: Payer: Self-pay | Admitting: Family Medicine

## 2020-07-26 MED ORDER — AMPHETAMINE-DEXTROAMPHETAMINE 10 MG PO TABS: 10.0000 mg | ORAL_TABLET | Freq: Two times a day (BID) | ORAL | 0 refills | Status: DC

## 2020-09-01 ENCOUNTER — Other Ambulatory Visit: Payer: Self-pay | Admitting: Dermatology

## 2020-09-08 ENCOUNTER — Other Ambulatory Visit: Payer: Self-pay

## 2020-09-08 ENCOUNTER — Ambulatory Visit: Payer: BC Managed Care – PPO | Admitting: Dermatology

## 2020-09-08 DIAGNOSIS — L700 Acne vulgaris: Secondary | ICD-10-CM

## 2020-09-08 DIAGNOSIS — D489 Neoplasm of uncertain behavior, unspecified: Secondary | ICD-10-CM

## 2020-09-08 MED ORDER — WINLEVI 1 % EX CREA: 1.0000 "application " | TOPICAL_CREAM | Freq: Every morning | CUTANEOUS | 6 refills | Status: DC

## 2020-09-08 MED ORDER — CLINDAMYCIN-TRETINOIN 1.2-0.025 % EX GEL: Freq: Every day | CUTANEOUS | 6 refills | Status: DC

## 2020-09-08 NOTE — Patient Instructions (Signed)
Recommend OC8 gel daily - sold on [employer].    If you have any questions or concerns for your doctor, please call our main line at [phone] and press option 4 to reach your doctor's medical assistant. If no one answers, please leave a voicemail as directed and we will return your call as soon as possible. Messages left after 4 pm will be answered the following business day.   You may also send [place] a message via [place]. We typically respond to MyChart messages within 1-2 business days.  For prescription refills, please ask your pharmacy to contact our office. Our fax number is [phone].  If you have an urgent issue when the clinic is closed that cannot wait until the next business day, you can page your doctor at the number below.    Please note that while we do our best to be available for urgent issues outside of office hours, we are not available 24/7.   If you have an urgent issue and are unable to reach [place], you may choose to seek medical care at your doctor's office, retail clinic, urgent care center, or emergency room.  If you have a medical emergency, please immediately call 911 or go to the emergency department.  Pager Numbers  - Dr. Nehemiah Massed: [phone]  - Dr. Laurence Ferrari: [phone]  - Dr. Nicole Kindred: [phone]  In the event of inclement weather, please call our main line at [phone] for an update on the status of any delays or closures.  Dermatology Medication Tips: Please keep the boxes that topical medications come in in order to help keep track of the instructions about where and how to use these. Pharmacies typically print the medication instructions only on the boxes and not directly on the medication tubes.   If your medication is too expensive, please contact our office at [phone] option 4 or send [place] a message through [place].   We are unable to tell what your co-pay for medications will be in advance as this is different depending on your insurance  coverage. However, we may be able to find a substitute medication at lower cost or fill out paperwork to get insurance to cover a needed medication.   If a prior authorization is required to get your medication covered by your insurance company, please allow [place] 1-2 business days to complete this process.  Drug prices often vary depending on where the prescription is filled and some pharmacies may offer cheaper prices.  The website www.goodrx.com contains coupons for medications through different pharmacies. The prices here do not account for what the cost may be with help from insurance (it may be cheaper with your insurance), but the website can give you the price if you did not use any insurance.  - You can print the associated coupon and take it with your prescription to the pharmacy.  - You may also stop by our office during regular business hours and pick up a GoodRx coupon card.  - If you need your prescription sent electronically to a different pharmacy, notify our office through [facility] or by phone at [phone] option 4.

## 2020-09-08 NOTE — Progress Notes (Signed)
   Follow-Up Visit   Subjective  Latasha Smith is a 35 y.o. female who presents for the following: Acne (Epiduo Forte - needs refills) and Other (Bump post scalp).  The following portions of the chart were reviewed this encounter and updated as appropriate:   Tobacco  Allergies  Meds  Problems  Med Hx  Surg Hx  Fam Hx     Review of Systems:  No other skin or systemic complaints except as noted in HPI or Assessment and Plan.  Objective  Well appearing patient in no apparent distress; mood and affect are within normal limits.  A focused examination was performed including scalp, face. Relevant physical exam findings are noted in the Assessment and Plan.  Right Occipital Scalp 1.0 cm flesh colored papule   Assessment & Plan  Acne vulgaris Head - Anterior (Face) And persistent.  Not to goal. D/C Epiduo Forte.  Start Ziana gel qhs,  Winlevi cream qam  Discussed OC8 gel for oiliness.  clindamycin-tretinoin (ZIANA) gel - Head - Anterior (Face) Apply topically at bedtime.  Clascoterone (WINLEVI) 1 % CREA - Head - Anterior (Face) Apply 1 application topically in the morning.  Neoplasm of uncertain behavior Right Occipital Scalp Cyst vs nevus vs scar - will plan excision   Return for Surgery - cyst vs other of scalp. Documentation: I have reviewed the above documentation for accuracy and completeness, and I agree with the above.  Sarina Ser, MD

## 2020-09-09 ENCOUNTER — Encounter: Payer: Self-pay | Admitting: Dermatology

## 2020-09-13 ENCOUNTER — Encounter: Payer: Self-pay | Admitting: Obstetrics and Gynecology

## 2020-09-21 ENCOUNTER — Ambulatory Visit: Payer: BC Managed Care – PPO | Admitting: Family Medicine

## 2020-10-04 ENCOUNTER — Encounter: Payer: Self-pay | Admitting: Obstetrics and Gynecology

## 2020-10-05 ENCOUNTER — Encounter: Payer: Self-pay | Admitting: Obstetrics and Gynecology

## 2020-10-12 ENCOUNTER — Telehealth: Payer: Self-pay

## 2020-10-12 ENCOUNTER — Ambulatory Visit (INDEPENDENT_AMBULATORY_CARE_PROVIDER_SITE_OTHER): Payer: BC Managed Care – PPO | Admitting: Dermatology

## 2020-10-12 ENCOUNTER — Other Ambulatory Visit: Payer: Self-pay

## 2020-10-12 ENCOUNTER — Encounter: Payer: Self-pay | Admitting: Dermatology

## 2020-10-12 DIAGNOSIS — L700 Acne vulgaris: Secondary | ICD-10-CM

## 2020-10-12 DIAGNOSIS — D485 Neoplasm of uncertain behavior of skin: Secondary | ICD-10-CM | POA: Diagnosis not present

## 2020-10-12 MED ORDER — DOXYCYCLINE HYCLATE 100 MG PO TABS: ORAL_TABLET | ORAL | 0 refills | Status: DC

## 2020-10-12 MED ORDER — CLINDAMYCIN PHOSPHATE 1 % EX LOTN: TOPICAL_LOTION | Freq: Every day | CUTANEOUS | 3 refills | Status: DC

## 2020-10-12 MED ORDER — MUPIROCIN 2 % EX OINT: 1.0000 "application " | TOPICAL_OINTMENT | Freq: Every day | CUTANEOUS | 0 refills | Status: DC

## 2020-10-12 MED ORDER — TRETINOIN 0.05 % EX CREA: TOPICAL_CREAM | Freq: Every day | CUTANEOUS | 3 refills | Status: DC

## 2020-10-12 NOTE — Progress Notes (Signed)
Follow-Up Visit   Subjective  Latasha Smith is a 35 y.o. female who presents for the following: Cyst (Right post scalp - excise today). She also would like to modify her acne regimen.  She would like to try a higher strength of tretinoin. She did not get Winlevi filled due to cost.  The following portions of the chart were reviewed this encounter and updated as appropriate:   Tobacco  Allergies  Meds  Problems  Med Hx  Surg Hx  Fam Hx     Review of Systems:  No other skin or systemic complaints except as noted in HPI or Assessment and Plan.  Objective  Well appearing patient in no apparent distress; mood and affect are within normal limits.  A focused examination was performed including scalp. Relevant physical exam findings are noted in the Assessment and Plan.  Right Occipital Scalp Thickened papule 1.2 x 1.0 cm   Assessment & Plan  Neoplasm of uncertain behavior of skin Right Occipital Scalp  Skin excision  Lesion length (cm):  1.2 Lesion width (cm):  1 Margin per side (cm):  0.1 Total excision diameter (cm):  1.4 Informed consent: discussed and consent obtained   Timeout: patient name, date of birth, surgical site, and procedure verified   Procedure prep:  Patient was prepped and draped in usual sterile fashion Prep type:  Isopropyl alcohol and povidone-iodine Anesthesia: the lesion was anesthetized in a standard fashion   Anesthetic:  1% lidocaine w/ epinephrine 1-100,000 buffered w/ 8.4% NaHCO3 Instrument used comment:  15c Hemostasis achieved with: pressure   Hemostasis achieved with comment:  Electrocautery Outcome: patient tolerated procedure well with no complications   Post-procedure details: sterile dressing applied and wound care instructions given   Dressing type: bandage and pressure dressing (mupirocin)    Skin repair Complexity:  Complex Final length (cm):  2 Reason for type of repair: reduce tension to allow closure, reduce the risk  of dehiscence, infection, and necrosis, reduce subcutaneous dead space and avoid a hematoma, allow closure of the large defect, preserve normal anatomy, preserve normal anatomical and functional relationships and enhance both functionality and cosmetic results   Undermining: area extensively undermined   Undermining comment:  Undermining defect 1.4 cm Subcutaneous layers (deep stitches):  Suture size:  4-0 Suture type: Vicryl (polyglactin 910)   Subcutaneous suture technique: inverted dermal. Fine/surface layer approximation (top stitches):  Suture size:  3-0 Suture type: nylon   Stitches: simple running   Suture removal (days):  7 Hemostasis achieved with: suture and pressure Outcome: patient tolerated procedure well with no complications   Post-procedure details: sterile dressing applied and wound care instructions given   Dressing type: bandage and pressure dressing (mupirocin)    mupirocin ointment (BACTROBAN) 2 % Apply 1 application topically daily. With dressing changes  doxycycline (VIBRA-TABS) 100 MG tablet Take one tab po BID x 1 week. Take with food.  Specimen 1 - Surgical pathology Differential Diagnosis: Cyst vs Prurigo Nodule Check Margins: No  May consider IL injections in the future. Start Doxycycline '100mg'$  po BID x 1 week.  Acne vulgaris Head - Anterior (Face) Chronic and persistent but improved.   Patient would like to try a higher strength of tretinoin.  She did not get Winlevi filled due to cost. Discontinue Ziana. Start Clindamycin lotion qam, Tretinoin 0.05% cream qhs. May apply moisturizer with tretinoin if it is too irritating.  May consider oral spironolactone in the future if not continuing to improve unless she wants to  try topical Winlevi again.  clindamycin (CLEOCIN-T) 1 % lotion - Head - Anterior (Face) Apply topically daily.  tretinoin (RETIN-A) 0.05 % cream - Head - Anterior (Face) Apply topically at bedtime.  Return in about 1 week (around  10/19/2020) for suture removal and Jan or Feb for acne follow up.  I, Ashok Cordia, CMA, am acting as scribe for Sarina Ser, MD . Documentation: I have reviewed the above documentation for accuracy and completeness, and I agree with the above.  Sarina Ser, MD

## 2020-10-12 NOTE — Telephone Encounter (Signed)
Spoke with patient regarding surgery. She is doing fine/hd 

## 2020-10-12 NOTE — Patient Instructions (Signed)
Wound Care Instructions  Cleanse wound gently with soap and water once a day then pat dry with clean gauze. Apply a thing coat of Petrolatum (petroleum jelly, "Vaseline") over the wound (unless you have an allergy to this). We recommend that you use a new, sterile tube of Vaseline. Do not pick or remove scabs. Do not remove the yellow or white "healing tissue" from the base of the wound.  Cover the wound with fresh, clean, nonstick gauze and secure with paper tape. You may use Band-Aids in place of gauze and tape if the would is small enough, but would recommend trimming much of the tape off as there is often too much. Sometimes Band-Aids can irritate the skin.  You should call the office for your biopsy report after 1 week if you have not already been contacted.  If you experience any problems, such as abnormal amounts of bleeding, swelling, significant bruising, significant pain, or evidence of infection, please call the office immediately.  FOR ADULT SURGERY PATIENTS: If you need something for pain relief you may take 1 extra strength Tylenol (acetaminophen) AND 2 Ibuprofen (200mg each) together every 4 hours as needed for pain. (do not take these if you are allergic to them or if you have a reason you should not take them.) Typically, you may only need pain medication for 1 to 3 days.   If you have any questions or concerns for your doctor, please call our main line at [phone] and press option 4 to reach your doctor's medical assistant. If no one answers, please leave a voicemail as directed and we will return your call as soon as possible. Messages left after 4 pm will be answered the following business day.   You may also send us a message via MyChart. We typically respond to MyChart messages within 1-2 business days.  For prescription refills, please ask your pharmacy to contact our office. Our fax number is [phone].  If you have an urgent issue when the clinic is closed that  cannot wait until the next business day, you can page your doctor at the number below.    Please note that while we do our best to be available for urgent issues outside of office hours, we are not available 24/7.   If you have an urgent issue and are unable to reach us, you may choose to seek medical care at your doctor's office, retail clinic, urgent care center, or emergency room.  If you have a medical emergency, please immediately call 911 or go to the emergency department.  Pager Numbers  - Dr. Kowalski: [phone]  - Dr. Moye: [phone]  - Dr. Stewart: [phone]  In the event of inclement weather, please call our main line at [phone] for an update on the status of any delays or closures.  Dermatology Medication Tips: Please keep the boxes that topical medications come in in order to help keep track of the instructions about where and how to use these. Pharmacies typically print the medication instructions only on the boxes and not directly on the medication tubes.   If your medication is too expensive, please contact our office at [phone] option 4 or send us a message through MyChart.   We are unable to tell what your co-pay for medications will be in advance as this is different depending on your insurance coverage. However, we may be able to find a substitute medication at lower cost or fill out paperwork to get insurance to cover a needed   medication.   If a prior authorization is required to get your medication covered by your insurance company, please allow us 1-2 business days to complete this process.  Drug prices often vary depending on where the prescription is filled and some pharmacies may offer cheaper prices.  The website www.goodrx.com contains coupons for medications through different pharmacies. The prices here do not account for what the cost may be with help from insurance (it may be cheaper with your insurance), but the website can give you the  price if you did not use any insurance.  - You can print the associated coupon and take it with your prescription to the pharmacy.  - You may also stop by our office during regular business hours and pick up a GoodRx coupon card.  - If you need your prescription sent electronically to a different pharmacy, notify our office through [place] MyChart or by phone at [phone] option 4.   

## 2020-10-13 ENCOUNTER — Encounter: Payer: Self-pay | Admitting: Family Medicine

## 2020-10-13 ENCOUNTER — Ambulatory Visit: Payer: Self-pay | Admitting: *Deleted

## 2020-10-13 ENCOUNTER — Telehealth: Payer: Self-pay

## 2020-10-13 NOTE — Telephone Encounter (Signed)
Copied from [place] [id number]. Topic: Appointment Scheduling - Scheduling Inquiry for Clinic >> Oct 13, 2020  4:30 PM Oneta Rack wrote: Can patient be work in for a possible UTI , patient experiencing frequent urination, blood in urine and back pain for 1 day , patient unsure if she should go to an urgent care sent a message to [facility] Nurse Triage, please advise today if patient can be work in or drop of urine.

## 2020-10-13 NOTE — Telephone Encounter (Signed)
Patient called and says she's been having frequent urination, urgency, blood in urine, and dribbling urine that started this morning. She says now her back is starting to hurt at a 7 and burning with urination. Denies fever. I advised e-visit through MyChart of urgent care. She asks if there are any video visits available with her provider or any provider in the office. No availability with PCP, MyChart Video Visit scheduled for tomorrow at 1400 with Vernie Murders, PA-C, care advice given, patient verbalized understanding.  Summary: UTI   Frequent urination, blood in urine and back pain for 1 day , patient unsure if she should go to an urgent care, please advise today. (Sent a CRM to the practice for a possible work in)       Reason for Disposition  Urinating more frequently than usual (i.e., frequency)  Answer Assessment - Initial Assessment Questions 1. SYMPTOM: "What's the main symptom you're concerned about?" (e.g., frequency, incontinence)     Frequency, urgency 2. ONSET: "When did the symptoms start?"     This morning 3. PAIN: "Is there any pain?" If Yes, ask: "How bad is it?" (Scale: 1-10; mild, moderate, severe)     Yes 7 4. CAUSE: "What do you think is causing the symptoms?"     UTI 5. OTHER SYMPTOMS: "Do you have any other symptoms?" (e.g., fever, flank pain, blood in urine, pain with urination)     Blood in urine, back pain, burning with urination 6. PREGNANCY: "Is there any chance you are pregnant?" "When was your last menstrual period?"     No  Protocols used: Urinary Symptoms-A-AH

## 2020-10-13 NOTE — Telephone Encounter (Signed)
Patient sent a mychart message about this. I replied to patient's Mychart message  advising her to go to urgent care since no available appointments.

## 2020-10-13 NOTE — Telephone Encounter (Signed)
Frequent urination, back pain x 1 day. Patient requesting if she should go to UC . Please advise.   Called patient to review symptoms . No answer, left voicemail to call clinic back.

## 2020-10-14 ENCOUNTER — Telehealth (INDEPENDENT_AMBULATORY_CARE_PROVIDER_SITE_OTHER): Payer: BC Managed Care – PPO | Admitting: Family Medicine

## 2020-10-14 ENCOUNTER — Other Ambulatory Visit: Payer: Self-pay

## 2020-10-14 ENCOUNTER — Encounter: Payer: Self-pay | Admitting: Family Medicine

## 2020-10-14 DIAGNOSIS — N3001 Acute cystitis with hematuria: Secondary | ICD-10-CM

## 2020-10-14 MED ORDER — SULFAMETHOXAZOLE-TRIMETHOPRIM 800-160 MG PO TABS: 1.0000 | ORAL_TABLET | Freq: Two times a day (BID) | ORAL | 0 refills | Status: DC

## 2020-10-14 NOTE — Progress Notes (Signed)
MyChart Video Visit    Virtual Visit via Video Note   This visit type was conducted due to national recommendations for restrictions regarding the COVID-19 Pandemic (e.g. social distancing) in an effort to limit this patient's exposure and mitigate transmission in our community. This patient is at least at moderate risk for complications without adequate follow up. This format is felt to be most appropriate for this patient at this time. Physical exam was limited by quality of the video and audio technology used for the visit.   Patient location: home Provider location: office  I discussed the limitations of evaluation and management by telemedicine and the availability of in person appointments. The patient expressed understanding and agreed to proceed.  Patient: Latasha Smith   DOB: 02-14-1986   35 y.o. Female  MRN: [id number] Visit Date: 10/14/2020  Today's healthcare provider: Vernie Murders, PA-C   No chief complaint on file.  Subjective    HPI   Patient reports needing to leave home at 2:45 pm to pick up son from school but will join video visit via cell phone if link can be sent at time of visit.She is currently logged on at home. Urinary symptoms  She reports new onset flank pain, urinary frequency, urinary urgency, and blood . The current episode started  yesterday morning  and is gradually improving. Patient states symptoms are 5/10 in intensity, occurring  only when urinating . She  has not been recently treated for similar symptoms.    Associated symptoms: No abdominal pain Yes back pain  Yes chills No constipation  No cramping Yes diarrhea  Yes discharge No fever  No hematuria Yes nausea  No vomiting    ---------------------------------------------------------------------------------------  Past Medical History:  Diagnosis Date   Asthma    As a child   Chicken pox    Dysplastic nevus 12/13/2017   R post side - moderate   Dysplastic nevus  08/27/2018   R ant side - severe, excision   History of UTI    Migraine    Past Surgical History:  Procedure Laterality Date   NO PAST SURGERIES     Family History  Problem Relation Age of Onset   Alcoholism Paternal Grandfather    Lung cancer Paternal Grandfather        smoker   Arthritis Maternal Grandmother    Depression Maternal Grandmother    Heart disease Maternal Grandfather 69   Hypertension Maternal Grandfather    Diabetes Maternal Grandfather    Melanoma Maternal Grandfather    Stroke Paternal Grandmother 15   Depression Paternal Grandmother    Depression Mother    Healthy Father    Healthy Sister    Healthy Brother    Colon cancer Neg Hx    Breast cancer Neg Hx    Ovarian cancer Neg Hx    Cervical cancer Neg Hx    Social History   Tobacco Use   Smoking status: Never   Smokeless tobacco: Never  Vaping Use   Vaping Use: Never used  Substance Use Topics   Alcohol use: Yes    Alcohol/week: 3.0 standard drinks    Types: 3 Glasses of wine per week   Drug use: No   No Known Allergies   Medications: Outpatient Medications Prior to Visit  Medication Sig   Adapalene-Benzoyl Peroxide 0.3-2.5 % GEL APPLY ONE APPLICATION TOPICALLY AT BEDTIME. PATIENT NEEDS APPOINTMENT   amoxicillin-clavulanate (AUGMENTIN) 875-125 MG tablet Take 1 tablet by mouth 2 (two) times daily. (  Patient not taking: Reported on 10/12/2020)   amphetamine-dextroamphetamine (ADDERALL) 10 MG tablet Take 1 tablet (10 mg total) by mouth 2 (two) times daily.   clindamycin (CLEOCIN-T) 1 % lotion Apply topically daily.   doxycycline (VIBRA-TABS) 100 MG tablet Take one tab po BID x 1 week. Take with food.   mupirocin ointment (BACTROBAN) 2 % Apply 1 application topically daily. With dressing changes   predniSONE (STERAPRED UNI-PAK 21 TAB) 10 MG (21) TBPK tablet PO: Take 6 tablets on day 1:Take 5 tablets day 2:Take 4 tablets day 3: Take 3 tablets day 4:Take 2 tablets day five: 5 Take 1 tablet day 6  (Patient not taking: Reported on 10/12/2020)   SUMAtriptan (IMITREX) 50 MG tablet Take 1 tablet (50 mg total) by mouth daily as needed for migraine. May repeat in 2 hours if headache persists or recurs.   tretinoin (RETIN-A) 0.05 % cream Apply topically at bedtime.   No facility-administered medications prior to visit.    Review of Systems  Constitutional:  Positive for chills.  HENT: Negative.    Respiratory: Negative.    Genitourinary:  Positive for dysuria and hematuria.     Objective    There were no vitals taken for this visit.   Physical Exam: WDWN female in no apparent distress.  Head: Normocephalic, atraumatic. Neck: Supple, NROM Respiratory: No apparent distress Psych: Normal mood and affect      Assessment & Plan     1. Acute cystitis with hematuria Developed urinary frequency, hematuria, burning and some chills yesterday. Some relief of symptoms with use of AZO-Standard and increase water intake. Recheck after finishing Septra-DS if any symptoms remain. Will not start the Doxycycline her dermatologist gave her until finishing treatment of the UTI. - sulfamethoxazole-trimethoprim (BACTRIM DS) 800-160 MG tablet; Take 1 tablet by mouth 2 (two) times daily.  Dispense: 14 tablet; Refill: 0   No follow-ups on file.     I discussed the assessment and treatment plan with the patient. The patient was provided an opportunity to ask questions and all were answered. The patient agreed with the plan and demonstrated an understanding of the instructions.   The patient was advised to call back or seek an in-person evaluation if the symptoms worsen or if the condition fails to improve as anticipated.  I provided 20 minutes of non-face-to-face time during this encounter.  I, Dennis Chrismon, PA-C, have reviewed all documentation for this visit. The documentation on 10/14/20 for the exam, diagnosis, procedures, and orders are all accurate and complete.   Vernie Murders,  PA-C [employer] [phone] (phone) [phone] (fax)  [place]

## 2020-10-19 ENCOUNTER — Ambulatory Visit (INDEPENDENT_AMBULATORY_CARE_PROVIDER_SITE_OTHER): Payer: BC Managed Care – PPO

## 2020-10-19 ENCOUNTER — Other Ambulatory Visit: Payer: Self-pay

## 2020-10-19 DIAGNOSIS — Z4802 Encounter for removal of sutures: Secondary | ICD-10-CM

## 2020-10-19 NOTE — Progress Notes (Signed)
   Follow-Up Visit   Subjective  Latasha Smith is a 35 y.o. female who presents for the following: Suture / Staple Removal (Suture removal for in office excision. Path pending.  ).    The following portions of the chart were reviewed this encounter and updated as appropriate:        Objective  Well appearing patient in no apparent distress; mood and affect are within normal limits.   Scalp Incision site is clean, dry and intact    Assessment & Plan  Encounter for removal of sutures Scalp  Wound cleansed, sutures removed, wound cleansed.. Discussed pathology results still pending.    No follow-ups on file.  I, Harriett Sine, CMA, am acting as scribe for [employer] [facility] [employer], CMA.

## 2020-10-20 ENCOUNTER — Telehealth: Payer: Self-pay

## 2020-10-20 NOTE — Telephone Encounter (Signed)
-----   Message from Ralene Bathe, MD sent at 10/20/2020  2:28 PM EDT ----- Diagnosis Skin (M), right occipital scalp FIBROSIS, NO EVIDENCE OF MALIGNANCY  Scar tissue  - most consistent with ruptured cyst with scar

## 2020-10-20 NOTE — Telephone Encounter (Signed)
Left message for patient to call office for results/hd 

## 2020-10-27 ENCOUNTER — Encounter: Payer: BC Managed Care – PPO | Admitting: Obstetrics and Gynecology

## 2020-11-05 ENCOUNTER — Telehealth: Payer: Self-pay

## 2020-11-05 NOTE — Telephone Encounter (Signed)
-----   Message from Ralene Bathe, MD sent at 10/20/2020  2:28 PM EDT ----- Diagnosis Skin (M), right occipital scalp FIBROSIS, NO EVIDENCE OF MALIGNANCY  Scar tissue  - most consistent with ruptured cyst with scar

## 2020-11-05 NOTE — Telephone Encounter (Signed)
Left pt msg to call for bx results/sh 

## 2020-12-06 ENCOUNTER — Ambulatory Visit: Payer: Self-pay

## 2020-12-06 ENCOUNTER — Encounter: Payer: Self-pay | Admitting: Family Medicine

## 2020-12-06 NOTE — Telephone Encounter (Signed)
Reason for Disposition  [1] MILD-MODERATE mouth pain AND [2] present > 3 days  Answer Assessment - Initial Assessment Questions 1. ONSET: "When did the mouth start hurting?" (e.g., hours or days ago)      1 week ago 2. SEVERITY: "How bad is the pain?" (Scale 1-10; mild, moderate or severe)   - MILD (1-3):  doesn't interfere with eating or normal activities   - MODERATE (4-7): interferes with eating some solids and normal activities   - SEVERE (8-10):  excruciating pain, interferes with most normal activities   - SEVERE DYSPHAGIA: can't swallow liquids, drooling     3 3. SORES: "Are there any sores or ulcers in the mouth?" If Yes, ask: "What part of the mouth are the sores in?"     no 4. FEVER: "Do you have a fever?" If Yes, ask: "What is your temperature, how was it measured, and when did it start?"     no 5. CAUSE: "What do you think is causing the mouth pain?"     unknown 6. OTHER SYMPTOMS: "Do you have any other symptoms?" (e.g., difficulty breathing)     no  Protocols used: Mouth Pain-A-AH

## 2020-12-06 NOTE — Telephone Encounter (Signed)
Returned pt's call.   Pt has had jaw pain for just over a week. Pt wakes up with mouth pain which increases through the day. No fever or swollen lymph nodes.   PT does grind her teeth at night. Pt is afraid that this may be an infection in her jaw.  Per protocol pt should be seen within 3 days. Unable to secure an appointment. 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   Called practice. They were able to book an appointment for Weds 12/08/2020 at 2:40.  Office said they will c/b pt regarding appointment.

## 2020-12-08 ENCOUNTER — Ambulatory Visit (INDEPENDENT_AMBULATORY_CARE_PROVIDER_SITE_OTHER): Payer: BC Managed Care – PPO | Admitting: Physician Assistant

## 2020-12-08 ENCOUNTER — Other Ambulatory Visit: Payer: Self-pay

## 2020-12-08 ENCOUNTER — Encounter: Payer: Self-pay | Admitting: Physician Assistant

## 2020-12-08 VITALS — BP 98/60 | HR 61 | Temp 97.9°F | Resp 15 | Wt 132.6 lb

## 2020-12-08 DIAGNOSIS — M26622 Arthralgia of left temporomandibular joint: Secondary | ICD-10-CM | POA: Insufficient documentation

## 2020-12-08 MED ORDER — NAPROXEN 500 MG PO TABS: 500.0000 mg | ORAL_TABLET | Freq: Two times a day (BID) | ORAL | 0 refills | Status: DC

## 2020-12-08 MED ORDER — CYCLOBENZAPRINE HCL 5 MG PO TABS: 5.0000 mg | ORAL_TABLET | Freq: Three times a day (TID) | ORAL | 1 refills | Status: DC | PRN

## 2020-12-08 NOTE — Assessment & Plan Note (Addendum)
Advised naproxen 500 mg BID for up to 7 days for pain relief Ice/heat as needed. Flexeril 5 mg as needed-might be best at bedtime. If pain persists or becomes recurrent, she can f/u with dentist for mouthguard fitting at night. Follow up as needed.

## 2020-12-08 NOTE — Progress Notes (Signed)
Established patient visit   Patient: Latasha Smith   DOB: 1985/09/16   35 y.o. Female  MRN: [id number] Visit Date: 12/08/2020  Today's healthcare provider: Mikey Kirschner, PA-C   Cc. Jaw pain Subjective    HPI HPI     Jaw Pain    Additional comments: Patient presents in office today with concerns of pain on the left side of her jaw and states that pain has been present for one week. Patient states at times she feels her jaw pop when she extends it open or moves her mouth in a certain direction. Patient denies any recent injury or dental procedure. Patient states that she has tried otc Ibuprofen with little relief.       Last edited by Minette Headland, CMA on 12/08/2020  2:57 PM.      Latasha Smith is a 35 year old female who presents today with left-sided jaw pain x1 week. She states it is more painful when chewing or opening her mouth wide and she heard a popping noise a few times with significant pain on the left. Reports pain is worse in the morning, she thinks she clenches/grinds her teeth at night. Denies radiation of pain, still able to speak and talk.     Medications: Outpatient Medications Prior to Visit  Medication Sig   Adapalene-Benzoyl Peroxide 0.3-2.5 % GEL APPLY ONE APPLICATION TOPICALLY AT BEDTIME. PATIENT NEEDS APPOINTMENT   amphetamine-dextroamphetamine (ADDERALL) 10 MG tablet Take 1 tablet (10 mg total) by mouth 2 (two) times daily.   clindamycin (CLEOCIN-T) 1 % lotion Apply topically daily.   SUMAtriptan (IMITREX) 50 MG tablet Take 1 tablet (50 mg total) by mouth daily as needed for migraine. May repeat in 2 hours if headache persists or recurs.   tretinoin (RETIN-A) 0.05 % cream Apply topically at bedtime.   [DISCONTINUED] doxycycline (VIBRA-TABS) 100 MG tablet Take one tab po BID x 1 week. Take with food. (Patient not taking: Reported on 12/08/2020)   [DISCONTINUED] mupirocin ointment (BACTROBAN) 2 % Apply 1 application topically daily. With  dressing changes (Patient not taking: Reported on 12/08/2020)   [DISCONTINUED] predniSONE (STERAPRED UNI-PAK 21 TAB) 10 MG (21) TBPK tablet PO: Take 6 tablets on day 1:Take 5 tablets day 2:Take 4 tablets day 3: Take 3 tablets day 4:Take 2 tablets day five: 5 Take 1 tablet day 6 (Patient not taking: Reported on 12/08/2020)   [DISCONTINUED] sulfamethoxazole-trimethoprim (BACTRIM DS) 800-160 MG tablet Take 1 tablet by mouth 2 (two) times daily. (Patient not taking: Reported on 12/08/2020)   No facility-administered medications prior to visit.    Review of Systems  Musculoskeletal:        Jaw pain   All other systems reviewed and are negative.     Objective    Blood pressure 98/60, pulse 61, temperature 97.9 F (36.6 C), temperature source Temporal, resp. rate 15, weight 132 lb 9.6 oz (60.1 kg).   Physical Exam Constitutional:      General: She is awake.     Appearance: She is well-developed.  HENT:     Head: Normocephalic.     Jaw: There is normal jaw occlusion. Tenderness and pain on movement present. No swelling or malocclusion.  Eyes:     Conjunctiva/sclera: Conjunctivae normal.  Pulmonary:     Effort: Pulmonary effort is normal.  Skin:    General: Skin is warm.  Neurological:     Mental Status: She is alert and oriented to person, place, and time.  Psychiatric:  Attention and Perception: Attention normal.        Mood and Affect: Mood normal.        Speech: Speech normal.        Behavior: Behavior is cooperative.      No results found for any visits on 12/08/20.  Assessment & Plan     Problem List Items Addressed This Visit       Musculoskeletal and Integument   Arthralgia of left temporomandibular joint - Primary    Advised naproxen 500 mg BID for up to 7 days for pain relief Ice/heat as needed. Flexeril 5 mg as needed-might be best at bedtime. If pain persists or becomes recurrent, she can f/u with dentist for mouthguard fitting at night. Follow up as  needed.      Relevant Medications   cyclobenzaprine (FLEXERIL) 5 MG tablet   naproxen (NAPROSYN) 500 MG tablet     Return if symptoms worsen or fail to improve.      I, Mikey Kirschner, PA-C have reviewed all documentation for this visit. The documentation on  12/08/2020 for the exam, diagnosis, procedures, and orders are all accurate and complete.    Mikey Kirschner, PA-C  [facility] [phone] (phone) [phone] (fax)  [place]

## 2020-12-29 NOTE — Progress Notes (Deleted)
      Established patient visit   Patient: Latasha Smith   DOB: [birth year]   35 y.o. Female  MRN: [id number] Visit Date: 12/30/2020  Today's healthcare provider: Lavon Paganini, MD   No chief complaint on file.  Subjective    HPI  Urinary symptoms  She reports {chronicity:119221} {urinary symptoms:765916}. The current episode started {onset initial:119223} and is {progression:119226}. Patient states symptoms are {severity:119268} in intensity, occurring {frequency of symptoms:119294}. She  {recent treatment:18834} been recently treated for similar symptoms.    Associated symptoms: {Yes/No:20286} abdominal pain {Yes/No:20286} back pain  {Yes/No:20286} chills {Yes/No:20286} constipation  {Yes/No:20286} cramping {Yes/No:20286} diarrhea  {Yes/No:20286} discharge {Yes/No:20286} fever  {Yes/No:20286} hematuria {Yes/No:20286} nausea  {Yes/No:20286} vomiting    ---------------------------------------------------------------------------------------   {Link to patient history deactivated due to formatting error:1}  Medications: Outpatient Medications Prior to Visit  Medication Sig   Adapalene-Benzoyl Peroxide 0.3-2.5 % GEL APPLY ONE APPLICATION TOPICALLY AT BEDTIME. PATIENT NEEDS APPOINTMENT   amphetamine-dextroamphetamine (ADDERALL) 10 MG tablet Take 1 tablet (10 mg total) by mouth 2 (two) times daily.   clindamycin (CLEOCIN-T) 1 % lotion Apply topically daily.   cyclobenzaprine (FLEXERIL) 5 MG tablet Take 1 tablet (5 mg total) by mouth 3 (three) times daily as needed for muscle spasms.   naproxen (NAPROSYN) 500 MG tablet Take 1 tablet (500 mg total) by mouth 2 (two) times daily with a meal.   SUMAtriptan (IMITREX) 50 MG tablet Take 1 tablet (50 mg total) by mouth daily as needed for migraine. May repeat in 2 hours if headache persists or recurs.   tretinoin (RETIN-A) 0.05 % cream Apply topically at bedtime.   No facility-administered medications prior to visit.     Review of Systems      Objective    There were no vitals taken for this visit. BP Readings from Last 3 Encounters:  12/08/20 98/60  11/06/19 111/71  08/14/17 104/60   Wt Readings from Last 3 Encounters:  12/08/20 132 lb 9.6 oz (60.1 kg)  11/06/19 129 lb (58.5 kg)  08/14/17 129 lb (58.5 kg)      Physical Exam  ***  No results found for any visits on 12/30/20.  Assessment & Plan     ***  No follow-ups on file.      {provider attestation***:1}   Lavon Paganini, MD  [facility] [phone] (phone) [phone] (fax)  [place]

## 2020-12-30 ENCOUNTER — Other Ambulatory Visit: Payer: Self-pay

## 2020-12-30 ENCOUNTER — Ambulatory Visit: Payer: BC Managed Care – PPO | Admitting: Family Medicine

## 2021-01-13 ENCOUNTER — Encounter: Payer: Self-pay | Admitting: Family Medicine

## 2021-01-13 ENCOUNTER — Telehealth: Payer: BC Managed Care – PPO

## 2021-01-14 ENCOUNTER — Telehealth: Payer: BC Managed Care – PPO | Admitting: Nurse Practitioner

## 2021-01-14 DIAGNOSIS — J0100 Acute maxillary sinusitis, unspecified: Secondary | ICD-10-CM | POA: Diagnosis not present

## 2021-01-14 MED ORDER — AMOXICILLIN-POT CLAVULANATE 875-125 MG PO TABS: 1.0000 | ORAL_TABLET | Freq: Two times a day (BID) | ORAL | 0 refills | Status: DC

## 2021-01-14 NOTE — Patient Instructions (Signed)

## 2021-01-14 NOTE — Progress Notes (Signed)
Virtual Smith Consent   Latasha Smith, you are scheduled for a virtual Smith with Latasha Smith, Latasha Smith, a [facility] provider, today.     Just as with appointments in the office, your consent must be obtained to participate.  Your consent will be active for this Smith and any virtual Smith you may have with one of our providers in the next 365 days.     If you have a MyChart account, a copy of this consent can be sent to you electronically.  All virtual visits are billed to your insurance company just like a traditional Smith in the office.    As this is a virtual Smith, video technology does not allow for your provider to perform a traditional examination.  This may limit your provider's ability to fully assess your condition.  If your provider identifies any concerns that need to be evaluated in person or the need to arrange testing (such as labs, EKG, etc.), we will make arrangements to do so.     Although advances in technology are sophisticated, we cannot ensure that it will always work on either your end or our end.  If the connection with a video Smith is poor, the Smith may have to be switched to a telephone Smith.  With either a video or telephone Smith, we are not always able to ensure that we have a secure connection.     I need to obtain your verbal consent now.   Are you willing to proceed with your Smith today? YES   Latasha Smith (video or telephone).   Latasha Hassell Done, FNP   Date: 01/14/2021 9:35 AM   Virtual Smith via Video Note   I, Latasha Smith, connected with Latasha Smith ([id number], 1989/07/11) on 01/14/21 at  9:45 AM EST by a video-enabled telemedicine application and verified that I am speaking with the correct person using two identifiers.  Location: Patient: Virtual Smith Location Patient: Home Provider: Virtual Smith Location Provider: Mobile   I  discussed the limitations of evaluation and management by telemedicine and the availability of in person appointments. The patient expressed understanding and agreed to proceed.    History of Present Illness: Latasha Smith is a 35 y.o. who identifies as a female who was assigned female at birth, and is being seen today for sinusitis.  HPI: Everyone in family had flu over thanksgiving. She is still coughing, has intense sinus pressure which is also making her eyses and teeth hurt. She is now coughing greenish phlegm.   Review of Systems  Constitutional:  Positive for malaise/fatigue. Negative for chills and fever.  HENT:  Positive for congestion and sore throat (slight).   Respiratory:  Positive for cough and sputum production.   Musculoskeletal:  Negative for myalgias.  Neurological:  Positive for headaches (intermittent). Negative for dizziness.   Problems:  Patient Active Problem List   Diagnosis Date Noted  . Arthralgia of left temporomandibular joint 12/08/2020  . Acute non-recurrent pansinusitis 02/11/2020  . 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 Medication overuse headache 04/02/2017  . Migraine 02/17/2015  . Other known or suspected fetal abnormality, not elsewhere classified, affecting management of mother, antepartum condition or complication 37/05/8887    Allergies: No Known Allergies Medications:  Current Outpatient Medications:  .  Adapalene-Benzoyl Peroxide 0.3-2.5 % GEL, APPLY ONE APPLICATION TOPICALLY AT BEDTIME. PATIENT NEEDS APPOINTMENT, Disp: 60 g, Rfl: 0 .  amphetamine-dextroamphetamine (  ADDERALL) 10 MG tablet, Take 1 tablet (10 mg total) by mouth 2 (two) times daily., Disp: 60 tablet, Rfl: 0 .  clindamycin (CLEOCIN-T) 1 % lotion, Apply topically daily., Disp: 60 mL, Rfl: 3 .  cyclobenzaprine (FLEXERIL) 5 MG tablet, Take 1 tablet (5 mg total) by mouth 3 (three) times daily as needed for muscle spasms., Disp: 30 tablet, Rfl: 1 .   naproxen (NAPROSYN) 500 MG tablet, Take 1 tablet (500 mg total) by mouth 2 (two) times daily with a meal., Disp: 30 tablet, Rfl: 0 .  SUMAtriptan (IMITREX) 50 MG tablet, Take 1 tablet (50 mg total) by mouth daily as needed for migraine. May repeat in 2 hours if headache persists or recurs., Disp: 10 tablet, Rfl: 0 .  tretinoin (RETIN-A) 0.05 % cream, Apply topically at bedtime., Disp: 45 g, Rfl: 3  Observations/Objective: Patient is well-developed, well-nourished in no acute distress.  Resting comfortably  at home.  Head is normocephalic, atraumatic.  No labored breathing.  Speech is clear and coherent with logical content.  Patient is alert and oriented at baseline.  Voice Cough congestion  Assessment and Plan:  Latasha Smith in today with chief complaint of No chief complaint on file.   1. Acute non-recurrent maxillary sinusitis  1. Take meds as prescribed 2. Use a cool mist humidifier especially during the winter months and when heat has been humid. 3. Use saline nose sprays frequently 4. Saline irrigations of the nose can be very helpful if Smith frequently.  * 4X daily for 1 week*  * Use of a nettie pot can be helpful with this. Follow directions with this* 5. Drink plenty of fluids 6. Keep thermostat turn down low 7.For any cough or congestion  Use plain Mucinex- regular strength or max strength is fine   * Children- consult with Pharmacist for dosing 8. For fever or aces or pains- take tylenol or ibuprofen appropriate for age and weight.  * for fevers greater than 101 orally you may alternate ibuprofen and tylenol every  3 hours.     - amoxicillin-clavulanate (AUGMENTIN) 875-125 MG tablet; Take 1 tablet by mouth 2 (two) times daily.  Dispense: 14 tablet; Refill: 0   Follow Up Instructions: I discussed the assessment and treatment plan with the patient. The patient was provided an opportunity to ask questions and all were answered. The patient agreed with the  plan and demonstrated an understanding of the instructions.  A copy of instructions were sent to the patient via MyChart.  The patient was advised to call back or seek an in-person evaluation if the symptoms worsen or if the condition fails to improve as anticipated.  Time:  I spent 10 minutes with the patient via telehealth technology discussing the above problems/concerns.    Latasha Hassell Done, FNP

## 2021-03-21 ENCOUNTER — Ambulatory Visit: Payer: Self-pay | Admitting: Dermatology

## 2021-03-31 NOTE — Progress Notes (Signed)
Established patient visit   Patient: Latasha Smith   DOB: 23-Jan-1986   36 y.o. Female  MRN: [id number] Visit Date: 04/01/2021  Today's healthcare provider: Lavon Paganini, MD   Chief Complaint  Patient presents with   ADHD   Subjective    HPI  Patient is a 36 year old female who presents for medication management.  Patient was last seen for her ADD on 11/06/19.  She has only had acute visits since that time.  She has not had her Adderall from [place] since 07/26/20.  At that time she inquired about increasing her dose and was advised she would need to be seen to discuss that option.   When she first started on it she thought she had an allergic reaction but then attributed the rash she got to something else.    Restarted adderall a few days ago. When it works well it is exactly what she needs and controls symptoms well. Feels like it lasts long enough.   Medications: Outpatient Medications Prior to Visit  Medication Sig   SUMAtriptan (IMITREX) 50 MG tablet Take 1 tablet (50 mg total) by mouth daily as needed for migraine. May repeat in 2 hours if headache persists or recurs.   tretinoin (RETIN-A) 0.05 % cream Apply topically at bedtime.   [DISCONTINUED] Adapalene-Benzoyl Peroxide 0.3-2.5 % GEL APPLY ONE APPLICATION TOPICALLY AT BEDTIME. PATIENT NEEDS APPOINTMENT   [DISCONTINUED] amoxicillin-clavulanate (AUGMENTIN) 875-125 MG tablet Take 1 tablet by mouth 2 (two) times daily.   [DISCONTINUED] amphetamine-dextroamphetamine (ADDERALL) 10 MG tablet Take 1 tablet (10 mg total) by mouth 2 (two) times daily.   [DISCONTINUED] clindamycin (CLEOCIN-T) 1 % lotion Apply topically daily.   [DISCONTINUED] cyclobenzaprine (FLEXERIL) 5 MG tablet Take 1 tablet (5 mg total) by mouth 3 (three) times daily as needed for muscle spasms.   [DISCONTINUED] naproxen (NAPROSYN) 500 MG tablet Take 1 tablet (500 mg total) by mouth 2 (two) times daily with a meal.   No facility-administered medications  prior to visit.    Review of Systems  Psychiatric/Behavioral:  Positive for agitation and decreased concentration. Negative for behavioral problems, confusion, dysphoric mood, hallucinations, self-injury, sleep disturbance and suicidal ideas. The patient is not nervous/anxious and is not hyperactive.       Objective    BP 111/70 (BP Location: Left Arm, Patient Position: Sitting, Cuff Size: Normal)    Pulse 71    Temp 97.8 F (36.6 C) (Oral)    Wt 141 lb (64 kg)    SpO2 97%    BMI 24.20 kg/m  {Show previous vital signs (optional):23777}  Physical Exam Vitals reviewed.  Constitutional:      General: She is not in acute distress.    Appearance: Normal appearance. She is well-developed. She is not diaphoretic.  HENT:     Head: Normocephalic and atraumatic.  Eyes:     General: No scleral icterus.    Conjunctiva/sclera: Conjunctivae normal.  Neck:     Thyroid: No thyromegaly.  Cardiovascular:     Rate and Rhythm: Normal rate and regular rhythm.     Pulses: Normal pulses.     Heart sounds: Normal heart sounds. No murmur heard. Pulmonary:     Effort: Pulmonary effort is normal. No respiratory distress.     Breath sounds: Normal breath sounds. No wheezing, rhonchi or rales.  Musculoskeletal:     Cervical back: Neck supple.     Right lower leg: No edema.     Left lower leg:  No edema.  Lymphadenopathy:     Cervical: No cervical adenopathy.  Skin:    General: Skin is warm and dry.     Findings: No rash.  Neurological:     Mental Status: She is alert and oriented to person, place, and time. Mental status is at baseline.  Psychiatric:        Mood and Affect: Mood normal.        Behavior: Behavior normal.      No results found for any visits on 04/01/21.  Assessment & Plan     Problem List Items Addressed This Visit       Other   Attention deficit disorder (ADD) without hyperactivity - Primary    Chronic and uncontrolled Having trouble with medication not lasting through  the day Will switch to XR and increase dose slightly to 15mg  XR daily Recheck in 1 month and consider furhter titration        Return in about 4 weeks (around 04/29/2021) for ADD f/u, virtual ok.      Latasha Smith,acting as a scribe for Lavon Paganini, MD.,have documented all relevant documentation on the behalf of Lavon Paganini, MD,as directed by  Lavon Paganini, MD while in the presence of Lavon Paganini, MD.  I, Lavon Paganini, MD, have reviewed all documentation for this visit. The documentation on 04/01/21 for the exam, diagnosis, procedures, and orders are all accurate and complete.   Hendy Brindle, Dionne Bucy, MD, MPH [facility]

## 2021-04-01 ENCOUNTER — Encounter: Payer: Self-pay | Admitting: Family Medicine

## 2021-04-01 ENCOUNTER — Ambulatory Visit: Payer: BC Managed Care – PPO | Admitting: Family Medicine

## 2021-04-01 ENCOUNTER — Other Ambulatory Visit: Payer: Self-pay

## 2021-04-01 VITALS — BP 111/70 | HR 71 | Temp 97.8°F | Wt 141.0 lb

## 2021-04-01 DIAGNOSIS — F988 Other specified behavioral and emotional disorders with onset usually occurring in childhood and adolescence: Secondary | ICD-10-CM | POA: Diagnosis not present

## 2021-04-01 MED ORDER — AMPHETAMINE-DEXTROAMPHET ER 15 MG PO CP24: 15.0000 mg | ORAL_CAPSULE | ORAL | 0 refills | Status: DC

## 2021-04-01 NOTE — Assessment & Plan Note (Signed)
Chronic and uncontrolled Having trouble with medication not lasting through the day Will switch to XR and increase dose slightly to 15mg  XR daily Recheck in 1 month and consider furhter titration

## 2021-04-19 ENCOUNTER — Encounter: Payer: Self-pay | Admitting: Family Medicine

## 2021-04-28 NOTE — Progress Notes (Deleted)
? ? ?  MyChart Video Visit ? ? ? ?Virtual Visit via Video Note  ? ?This visit type was conducted due to national recommendations for restrictions regarding the COVID-19 Pandemic (e.g. social distancing) in an effort to limit this patient's exposure and mitigate transmission in our community. This patient is at least at moderate risk for complications without adequate follow up. This format is felt to be most appropriate for this patient at this time. Physical exam was limited by quality of the video and audio technology used for the visit.  ? ?Patient location: *** ?Provider location: *** ? ?I discussed the limitations of evaluation and management by telemedicine and the availability of in person appointments. The patient expressed understanding and agreed to proceed. ? ?Patient: Latasha Smith   DOB: January 15, 1986   36 y.o. Female  MRN: [id number] ?Visit Date: 04/29/2021 ? ?Today's healthcare provider: Lavon Paganini, MD  ? ?No chief complaint on file. ? ?Subjective  ?  ?HPI  ?Follow up for ADD ? ?The patient was last seen for this 1 months ago. ?Changes made at last visit include Will switch to XR and increase dose slightly to '15mg'$  XR daily. ? ?She reports {excellent/good/fair/poor:19665} compliance with treatment. ?She feels that condition is {improved/worse/unchanged:3041574}. ?She {is/is not:21021397} having side effects. *** ? ?-----------------------------------------------------------------------------------------  ? ? ?Medications: ?Outpatient Medications Prior to Visit  ?Medication Sig  ? amphetamine-dextroamphetamine (ADDERALL XR) 15 MG 24 hr capsule Take 1 capsule by mouth every morning.  ? SUMAtriptan (IMITREX) 50 MG tablet Take 1 tablet (50 mg total) by mouth daily as needed for migraine. May repeat in 2 hours if headache persists or recurs.  ? tretinoin (RETIN-A) 0.05 % cream Apply topically at bedtime.  ? ?No facility-administered medications prior to visit.  ? ? ?Review of Systems ? ?{Labs   Heme  Chem  Endocrine  Serology  Results Review (optional):23779} ? Objective  ?  ?There were no vitals taken for this visit. ?{Show previous vital signs (optional):23777} ? ?Physical Exam  ? ? ? Assessment & Plan  ?  ? ?*** ? ?No follow-ups on file.  ?  ? ?I discussed the assessment and treatment plan with the patient. The patient was provided an opportunity to ask questions and all were answered. The patient agreed with the plan and demonstrated an understanding of the instructions. ?  ?The patient was advised to call back or seek an in-person evaluation if the symptoms worsen or if the condition fails to improve as anticipated. ? ?I provided *** minutes of non-face-to-face time during this encounter. ? ?{provider attestation***:1} ? ?Lavon Paganini, MD ?[facility] ?[phone] (phone) ?[phone] (fax) ? ? [facility]   ?

## 2021-04-29 ENCOUNTER — Telehealth: Payer: BC Managed Care – PPO | Admitting: Family Medicine

## 2021-05-18 ENCOUNTER — Other Ambulatory Visit: Payer: Self-pay

## 2021-05-18 ENCOUNTER — Ambulatory Visit: Payer: BC Managed Care – PPO | Admitting: Dermatology

## 2021-05-18 DIAGNOSIS — L700 Acne vulgaris: Secondary | ICD-10-CM

## 2021-05-18 MED ORDER — TRETINOIN 0.05 % EX CREA: TOPICAL_CREAM | Freq: Every day | CUTANEOUS | 1 refills | Status: DC

## 2021-05-23 ENCOUNTER — Telehealth: Payer: BC Managed Care – PPO | Admitting: Family Medicine

## 2021-06-15 NOTE — Progress Notes (Signed)
? ? ? ?I,Roshena L Chambers,acting as a scribe for Lavon Paganini, MD.,have documented all relevant documentation on the behalf of Lavon Paganini, MD,as directed by  Lavon Paganini, MD while in the presence of Lavon Paganini, MD.  ? ?MyChart Video Visit ? ? ? ?Virtual Visit via Video Note  ? ?This visit type was conducted due to national recommendations for restrictions regarding the COVID-19 Pandemic (e.g. social distancing) in an effort to limit this patient's exposure and mitigate transmission in our community. This patient is at least at moderate risk for complications without adequate follow up. This format is felt to be most appropriate for this patient at this time. Physical exam was limited by quality of the video and audio technology used for the visit.  ? ? ?Patient location: home ?Provider location: [facility] ?Persons involved in the visit: patient, provider ? ?I discussed the limitations of evaluation and management by telemedicine and the availability of in person appointments. The patient expressed understanding and agreed to proceed. ? ?Patient: Latasha Smith   DOB: 1985/07/03   36 y.o. Female  MRN: [id number] ?Visit Date: 06/16/2021 ? ?Today's healthcare provider: Lavon Paganini, MD  ? ?Chief Complaint  ?Patient presents with  ? Follow-up  ? ADD  ? ?Subjective  ?  ?HPI  ?Follow up for ADD ? ?The patient was last seen for this 2 months ago. ?Changes made at last visit include switch to XR and increase dose slightly to '15mg'$  XR daily. Fu in one month and consider further titration.  ?Patient requesting to switch from Adderall to Vyvanse.  ?She reports good compliance with treatment. ?She feels that condition is Worse. ?She is having side effects. She states "the medication starts to wear off and I feel irritable and down".  ? ?Has some coworkers using Vyvanse and it seems to work  better. ? ?----------------------------------------------------------------------------------------- ? ? ?  06/16/2021  ?  2:01 PM 04/01/2021  ?  8:29 AM 11/06/2019  ?  4:11 PM  ?Depression screen PHQ 2/9  ?Decreased Interest 0 0 0  ?Down, Depressed, Hopeless 0 0 0  ?PHQ - 2 Score 0 0 0  ?Altered sleeping 3 2 0  ?Tired, decreased energy '2 3 2  '$ ?Change in appetite 0 1 0  ?Feeling bad or failure about yourself  0 0 0  ?Trouble concentrating '3 3 3  '$ ?Moving slowly or fidgety/restless 0 0 0  ?Suicidal thoughts 0 0 0  ?PHQ-9 Score '8 9 5  '$ ?Difficult doing work/chores Somewhat difficult Somewhat difficult Not difficult at all  ?  ? ? ?  06/16/2021  ?  2:02 PM 05/11/2017  ?  4:27 PM 04/02/2017  ? 11:01 AM  ?GAD 7 : Generalized Anxiety Score  ?Nervous, Anxious, on Edge '1 1 3  '$ ?Control/stop worrying 0 2 3  ?Worry too much - different things '1 2 3  '$ ?Trouble relaxing '1 1 3  '$ ?Restless 1 1 0  ?Easily annoyed or irritable '1 1 3  '$ ?Afraid - awful might happen 0 1 3  ?Total GAD 7 Score '5 9 18  '$ ?Anxiety Difficulty Somewhat difficult Somewhat difficult Extremely difficult  ? ? ? ?Medications: ?Outpatient Medications Prior to Visit  ?Medication Sig  ? SUMAtriptan (IMITREX) 50 MG tablet Take 1 tablet (50 mg total) by mouth daily as needed for migraine. May repeat in 2 hours if headache persists or recurs.  ? tretinoin (RETIN-A) 0.05 % cream Apply topically at bedtime.  ? [DISCONTINUED] amphetamine-dextroamphetamine (ADDERALL XR) 15 MG 24 hr  capsule Take 1 capsule by mouth every morning.  ? ?No facility-administered medications prior to visit.  ? ? ?Review of Systems per HPI ? ? ? ? Objective  ?  ?LMP 06/10/2021  ? ? ? ? ?Physical Exam ?Constitutional:   ?   General: She is not in acute distress. ?   Appearance: Normal appearance.  ?HENT:  ?   Head: Normocephalic.  ?Pulmonary:  ?   Effort: Pulmonary effort is normal. No respiratory distress.  ?Neurological:  ?   Mental Status: She is alert and oriented to person, place, and time. Mental status is  at baseline.  ?  ? ? ? Assessment & Plan  ?  ? ?Problem List Items Addressed This Visit   ? ?  ? Other  ? Attention deficit disorder (ADD) without hyperactivity - Primary  ?  Chronic and uncontrolled ?Failed short acting and XR Adderall ?Will try Vyvanse ?Start with '30mg'$  daily ?May titrate further after 1 month ? ?  ?  ?  ? ?Return in about 4 months (around 10/17/2021) for CPE, ADD f/u.  ?  ? ?I discussed the assessment and treatment plan with the patient. The patient was provided an opportunity to ask questions and all were answered. The patient agreed with the plan and demonstrated an understanding of the instructions. ?  ?The patient was advised to call back or seek an in-person evaluation if the symptoms worsen or if the condition fails to improve as anticipated. ? ?I, Lavon Paganini, MD, have reviewed all documentation for this visit. The documentation on 06/16/21 for the exam, diagnosis, procedures, and orders are all accurate and complete. ? ? ?Virginia Crews, MD, MPH ?[place] ?[facility]   ?

## 2021-06-16 ENCOUNTER — Encounter: Payer: Self-pay | Admitting: Family Medicine

## 2021-06-16 ENCOUNTER — Telehealth (INDEPENDENT_AMBULATORY_CARE_PROVIDER_SITE_OTHER): Payer: BC Managed Care – PPO | Admitting: Family Medicine

## 2021-06-16 DIAGNOSIS — F988 Other specified behavioral and emotional disorders with onset usually occurring in childhood and adolescence: Secondary | ICD-10-CM | POA: Diagnosis not present

## 2021-06-16 MED ORDER — LISDEXAMFETAMINE DIMESYLATE 30 MG PO CAPS: 30.0000 mg | ORAL_CAPSULE | Freq: Every day | ORAL | 0 refills | Status: DC

## 2021-06-16 NOTE — Assessment & Plan Note (Signed)
Chronic and uncontrolled ?Failed short acting and XR Adderall ?Will try Vyvanse ?Start with '30mg'$  daily ?May titrate further after 1 month ?

## 2021-06-17 ENCOUNTER — Encounter: Payer: Self-pay | Admitting: Family Medicine

## 2021-06-21 ENCOUNTER — Telehealth: Payer: BC Managed Care – PPO | Admitting: Family Medicine

## 2021-07-14 ENCOUNTER — Encounter: Payer: Self-pay | Admitting: Family Medicine

## 2021-07-14 MED ORDER — LISDEXAMFETAMINE DIMESYLATE 40 MG PO CAPS
40.0000 mg | ORAL_CAPSULE | Freq: Every day | ORAL | 0 refills | Status: DC
Start: 2021-07-14 — End: 2021-07-15

## 2021-07-15 MED ORDER — LISDEXAMFETAMINE DIMESYLATE 40 MG PO CAPS: 40.0000 mg | ORAL_CAPSULE | Freq: Every day | ORAL | 0 refills | Status: DC

## 2021-07-15 NOTE — Addendum Note (Signed)
Addended by: Virginia Crews on: 07/15/2021 07:52 AM   Modules accepted: Orders

## 2021-08-01 ENCOUNTER — Telehealth: Payer: BC Managed Care – PPO | Admitting: Physician Assistant

## 2021-08-01 DIAGNOSIS — J020 Streptococcal pharyngitis: Secondary | ICD-10-CM

## 2021-08-01 MED ORDER — AMOXICILLIN 500 MG PO CAPS: 500.0000 mg | ORAL_CAPSULE | Freq: Two times a day (BID) | ORAL | 0 refills | Status: AC

## 2021-08-01 NOTE — Progress Notes (Signed)
Virtual Visit Consent   Latasha Smith, you are scheduled for a virtual visit with a [facility] provider today. Just as with appointments in the office, your consent must be obtained to participate. Your consent will be active for this visit and any virtual visit you may have with one of our providers in the next 365 days. If you have a MyChart account, a copy of this consent can be sent to you electronically.  As this is a virtual visit, video technology does not allow for your provider to perform a traditional examination. This may limit your provider's ability to fully assess your condition. If your provider identifies any concerns that need to be evaluated in person or the need to arrange testing (such as labs, EKG, etc.), we will make arrangements to do so. Although advances in technology are sophisticated, we cannot ensure that it will always work on either your end or our end. If the connection with a video visit is poor, the visit may have to be switched to a telephone visit. With either a video or telephone visit, we are not always able to ensure that we have a secure connection.  By engaging in this virtual visit, you consent to the provision of healthcare and authorize for your insurance to be billed (if applicable) for the services provided during this visit. Depending on your insurance coverage, you may receive a charge related to this service.  I need to obtain your verbal consent now. Are you willing to proceed with your visit today? Latasha Smith has provided verbal consent on 08/01/2021 for a virtual visit (video or telephone). Mar Daring, PA-C  Date: 08/01/2021 1:17 PM  Virtual Visit via Video Note   I, Mar Daring, connected with  Latasha Smith  ([id number], [birth year]) on 08/01/21 at  1:15 PM EDT by a video-enabled telemedicine application and verified that I am speaking with the correct person using two identifiers.  Location: Patient:  Virtual Visit Location Patient: Home Provider: Virtual Visit Location Provider: Home Office   I discussed the limitations of evaluation and management by telemedicine and the availability of in person appointments. The patient expressed understanding and agreed to proceed.    History of Present Illness: Latasha Smith is a 36 y.o. who identifies as a female who was assigned female at birth, and is being seen today for sore throat.  HPI: Sore Throat  This is a new problem. The current episode started yesterday. The problem has been gradually worsening. Maximum temperature: subjective fever. Associated symptoms include congestion, headaches, swollen glands and trouble swallowing. Pertinent negatives include no ear discharge, hoarse voice or shortness of breath. She has had exposure to strep. She has had no exposure to mono. She has tried nothing for the symptoms. The treatment provided no relief.      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 May repeat in 2 hours if headache persists or recurs., Disp: 10 tablet, Rfl: 0   tretinoin (RETIN-A) 0.05 % cream, Apply topically at bedtime., Disp: 45 g, Rfl: 1  Observations/Objective: Patient is well-developed, well-nourished in no acute distress.  Resting comfortably at home.  Head is normocephalic,  atraumatic.  No labored breathing.  Speech is clear and coherent with logical content.  Patient is alert and oriented at baseline.    Assessment and Plan: 1. Strep throat - amoxicillin (AMOXIL) 500 MG capsule; Take 1 capsule (500 mg total) by mouth 2 (two) times daily for 10 days.  Dispense: 20 capsule; Refill: 0  - Suspect strep throat - Amoxicillin prescribed - Tylenol and Ibuprofen alternating every 4 hours - Salt water gargles - Chloraseptic spray - Liquid and soft food diet - Push fluids - New toothbrush in 3 days - Seek in person evaluation if not improving or if symptoms worsen   Follow Up Instructions: I discussed the assessment and treatment plan with the patient. The patient was provided an opportunity to ask questions and all were answered. The patient agreed with the plan and demonstrated an understanding of the instructions.  A copy of instructions were sent to the patient via MyChart unless otherwise noted below.    The patient was advised to call back or seek an in-person evaluation if the symptoms worsen or if the condition fails to improve as anticipated.  Time:  I spent 10 minutes with the patient via telehealth technology discussing the above problems/concerns.    Mar Daring, PA-C

## 2021-08-01 NOTE — Patient Instructions (Signed)
Alfredia Ferguson, thank you for joining Mar Daring, PA-C for today's virtual visit.  While this provider is not your primary care provider (PCP), if your PCP is located in our provider database this encounter information will be shared with them immediately following your visit.  Consent: (Patient) Latasha Smith provided verbal consent for this virtual visit at the beginning of the encounter.  Current Medications:  Current Outpatient Medications:    amoxicillin (AMOXIL) 500 MG capsule, Take 1 capsule (500 mg total) by mouth 2 (two) times daily for 10 days., Disp: 20 capsule, Rfl: 0   lisdexamfetamine (VYVANSE) 40 MG capsule, Take 1 capsule (40 mg total) by mouth daily., Disp: 30 capsule, Rfl: 0   SUMAtriptan (IMITREX) 50 MG tablet, Take 1 tablet (50 mg total) by mouth daily as needed for migraine. May repeat in 2 hours if headache persists or recurs., Disp: 10 tablet, Rfl: 0   tretinoin (RETIN-A) 0.05 % cream, Apply topically at bedtime., Disp: 45 g, Rfl: 1   Medications ordered in this encounter:  Meds ordered this encounter  Medications   amoxicillin (AMOXIL) 500 MG capsule    Sig: Take 1 capsule (500 mg total) by mouth 2 (two) times daily for 10 days.    Dispense:  20 capsule    Refill:  0    Order Specific Question:   Supervising Provider    Answer:   Sabra Heck, [place]     *If you need refills on other medications prior to your next appointment, please contact your pharmacy*  Follow-Up: Call back or seek an in-person evaluation if the symptoms worsen or if the condition fails to improve as anticipated.  Other Instructions Strep Throat, Adult Strep throat is an infection in the throat that is caused by bacteria. It is common during the cold months of the year. It mostly affects children who are 68-52 years old. However, people of all ages can get it at any time of the year. This infection spreads from person to person (is contagious) through coughing,  sneezing, or having close contact. Your health care provider may use other names to describe the infection. When strep throat affects the tonsils, it is called tonsillitis. When it affects the back of the throat, it is called pharyngitis. What are the causes? This condition is caused by the Streptococcus pyogenes bacteria. What increases the risk? You are more likely to develop this condition if: You care for school-age children, or are around school-age children. Children are more likely to get strep throat and may spread it to others. You spend time in crowded places where the infection can spread easily. You have close contact with someone who has strep throat. What are the signs or symptoms? Symptoms of this condition include: Fever or chills. Redness, swelling, or pain in the tonsils or throat. Pain or difficulty when swallowing. White or yellow spots on the tonsils or throat. Tender glands in the neck and under the jaw. Bad smelling breath. Red rash all over the body. This is rare. How is this diagnosed? This condition is diagnosed by tests that check for the presence and the amount of bacteria that cause strep throat. They are: Rapid strep test. Your throat is swabbed and checked for the presence of bacteria. Results are usually ready in minutes. Throat culture test. Your throat is swabbed. The sample is placed in a cup that allows infections to grow. Results are usually ready in 1 or 2 days. How is this treated? This condition  may be treated with: Medicines that kill germs (antibiotics). Medicines that relieve pain or fever. These include: Ibuprofen or acetaminophen. Aspirin, only for people who are over the age of 45. Throat lozenges. Throat sprays. Follow these instructions at home: Medicines  Take over-the-counter and prescription medicines only as told by your health care provider. Take your antibiotic medicine as told by your health care provider. Do not stop taking the  antibiotic even if you start to feel better. Eating and drinking  If you have trouble swallowing, try eating soft foods until your sore throat feels better. Drink enough fluid to keep your urine pale yellow. To help relieve pain, you may have: Warm fluids, such as soup and tea. Cold fluids, such as frozen desserts or popsicles. General instructions Gargle with a salt-water mixture 3-4 times a day or as needed. To make a salt-water mixture, completely dissolve -1 tsp (3-6 g) of salt in 1 cup (237 mL) of warm water. Get plenty of rest. Stay home from work or school until you have been taking antibiotics for 24 hours. Do not use any products that contain nicotine or tobacco. These products include cigarettes, chewing tobacco, and vaping devices, such as e-cigarettes. If you need help quitting, ask your health care provider. It is up to you to get your test results. Ask your health care provider, or the department that is doing the test, when your results will be ready. Keep all follow-up visits. This is important. How is this prevented?  Do not share food, drinking cups, or personal items that could cause the infection to spread to other people. Wash your hands often with soap and water for at least 20 seconds. If soap and water are not available, use hand sanitizer. Make sure that all people in your house wash their hands well. Have family members tested if they have a sore throat or fever. They may need an antibiotic if they have strep throat. Contact a health care provider if: You have swelling in your neck that keeps getting bigger. You develop a rash, cough, or earache. You cough up a thick mucus that is green, yellow-brown, or bloody. You have pain or discomfort that does not get better with medicine. Your symptoms seem to be getting worse. You have a fever. Get help right away if: You have new symptoms, such as vomiting, severe headache, stiff or painful neck, chest pain, or  shortness of breath. You have severe throat pain, drooling, or changes in your voice. You have swelling of the neck, or the skin on the neck becomes red and tender. You have signs of dehydration, such as tiredness (fatigue), dry mouth, and decreased urination. You become increasingly sleepy, or you cannot wake up completely. Your joints become red or painful. These symptoms may represent a serious problem that is an emergency. Do not wait to see if the symptoms will go away. Get medical help right away. Call your local emergency services (911 in the [place] Do not drive yourself to the hospital. Summary Strep throat is an infection in the throat that is caused by the Streptococcus pyogenes bacteria. This infection is spread from person to person (is contagious) through coughing, sneezing, or having close contact. Take your medicines, including antibiotics, as told by your health care provider. Do not stop taking the antibiotic even if you start to feel better. To prevent the spread of germs, wash your hands well with soap and water. Have others do the same. Do not share food, drinking cups,  or personal items. Get help right away if you have new symptoms, such as vomiting, severe headache, stiff or painful neck, chest pain, or shortness of breath. This information is not intended to replace advice given to you by your health care provider. Make sure you discuss any questions you have with your health care provider. Document Revised: 05/25/2020 Document Reviewed: 05/25/2020 Elsevier Patient Education  [place].    If you have been instructed to have an in-person evaluation today at a local Urgent Care facility, please use the link below. It will take you to a list of all of our available [facility], including address, phone number and hours of operation. Please do not delay care.  [facility]  If you or a family member do not have a primary care provider, use  the link below to schedule a visit and establish care. When you choose a [facility] primary care physician or advanced practice provider, you gain a long-term partner in health. Find a Primary Care Provider  Learn more about [facility]'s in-office and virtual care options: [facility]

## 2021-08-02 ENCOUNTER — Ambulatory Visit: Payer: BC Managed Care – PPO | Admitting: Physician Assistant

## 2021-08-02 NOTE — Progress Notes (Deleted)
    I,Sha'taria Kaysha Parsell,acting as a Education administrator for [employer], PA-C.,have documented all relevant documentation on the behalf of Mikey Kirschner, PA-C,as directed by  Mikey Kirschner, PA-C while in the presence of Mikey Kirschner, PA-C.   Acute Office Visit  Subjective:     Patient ID: Latasha Smith, female    DOB: [birth year], 36 y.o.   MRN: [id number]  No chief complaint on file.   HPI Patient is in today for possible strep.  ROS      Objective:    There were no vitals taken for this visit. {Vitals History (Optional):23777}  Physical Exam  No results found for any visits on 08/02/21.      Assessment & Plan:   Problem List Items Addressed This Visit   None   No orders of the defined types were placed in this encounter.   No follow-ups on file.  Beverlee Nims, Rafael Hernandez

## 2021-08-04 ENCOUNTER — Ambulatory Visit: Payer: BC Managed Care – PPO | Admitting: Family Medicine

## 2021-08-06 ENCOUNTER — Telehealth: Payer: BC Managed Care – PPO | Admitting: Nurse Practitioner

## 2021-08-14 ENCOUNTER — Telehealth: Payer: BC Managed Care – PPO | Admitting: Family

## 2021-08-14 DIAGNOSIS — R399 Unspecified symptoms and signs involving the genitourinary system: Secondary | ICD-10-CM

## 2021-08-14 MED ORDER — CEPHALEXIN 500 MG PO CAPS: 500.0000 mg | ORAL_CAPSULE | Freq: Two times a day (BID) | ORAL | 0 refills | Status: DC

## 2021-08-14 NOTE — Progress Notes (Signed)

## 2021-08-22 ENCOUNTER — Other Ambulatory Visit: Payer: Self-pay | Admitting: Family Medicine

## 2021-08-22 MED ORDER — LISDEXAMFETAMINE DIMESYLATE 40 MG PO CAPS: 40.0000 mg | ORAL_CAPSULE | Freq: Every day | ORAL | 0 refills | Status: DC

## 2021-10-04 ENCOUNTER — Other Ambulatory Visit: Payer: Self-pay | Admitting: Dermatology

## 2021-10-04 DIAGNOSIS — L700 Acne vulgaris: Secondary | ICD-10-CM

## 2021-10-07 ENCOUNTER — Other Ambulatory Visit: Payer: Self-pay | Admitting: Family Medicine

## 2021-10-07 MED ORDER — LISDEXAMFETAMINE DIMESYLATE 40 MG PO CAPS: 40.0000 mg | ORAL_CAPSULE | Freq: Every day | ORAL | 0 refills | Status: DC

## 2021-12-04 ENCOUNTER — Other Ambulatory Visit: Payer: Self-pay | Admitting: Family Medicine

## 2021-12-05 MED ORDER — LISDEXAMFETAMINE DIMESYLATE 40 MG PO CAPS: 40.0000 mg | ORAL_CAPSULE | Freq: Every day | ORAL | 0 refills | Status: DC

## 2021-12-14 ENCOUNTER — Encounter: Payer: Self-pay | Admitting: Family Medicine

## 2021-12-15 MED ORDER — LISDEXAMFETAMINE DIMESYLATE 40 MG PO CAPS: 40.0000 mg | ORAL_CAPSULE | Freq: Every day | ORAL | 0 refills | Status: DC

## 2021-12-23 ENCOUNTER — Ambulatory Visit: Payer: BC Managed Care – PPO | Admitting: Dermatology

## 2021-12-23 DIAGNOSIS — Z1283 Encounter for screening for malignant neoplasm of skin: Secondary | ICD-10-CM

## 2021-12-23 DIAGNOSIS — D492 Neoplasm of unspecified behavior of bone, soft tissue, and skin: Secondary | ICD-10-CM

## 2021-12-23 DIAGNOSIS — L700 Acne vulgaris: Secondary | ICD-10-CM | POA: Diagnosis not present

## 2021-12-23 DIAGNOSIS — L578 Other skin changes due to chronic exposure to nonionizing radiation: Secondary | ICD-10-CM

## 2021-12-23 DIAGNOSIS — D2272 Melanocytic nevi of left lower limb, including hip: Secondary | ICD-10-CM

## 2021-12-23 DIAGNOSIS — D229 Melanocytic nevi, unspecified: Secondary | ICD-10-CM

## 2021-12-23 DIAGNOSIS — Z79899 Other long term (current) drug therapy: Secondary | ICD-10-CM | POA: Diagnosis not present

## 2021-12-23 DIAGNOSIS — D235 Other benign neoplasm of skin of trunk: Secondary | ICD-10-CM

## 2021-12-23 DIAGNOSIS — B360 Pityriasis versicolor: Secondary | ICD-10-CM | POA: Diagnosis not present

## 2021-12-23 DIAGNOSIS — L814 Other melanin hyperpigmentation: Secondary | ICD-10-CM

## 2021-12-23 DIAGNOSIS — Z86018 Personal history of other benign neoplasm: Secondary | ICD-10-CM

## 2021-12-23 DIAGNOSIS — L821 Other seborrheic keratosis: Secondary | ICD-10-CM

## 2021-12-23 MED ORDER — KETOCONAZOLE 2 % EX SHAM: MEDICATED_SHAMPOO | CUTANEOUS | 11 refills | Status: AC

## 2021-12-23 MED ORDER — TRETINOIN 0.1 % EX CREA: TOPICAL_CREAM | Freq: Every day | CUTANEOUS | 11 refills | Status: AC

## 2021-12-23 NOTE — Patient Instructions (Addendum)
Wound Care Instructions  Cleanse wound gently with soap and water once a day then pat dry with clean gauze. Apply a thin coat of Petrolatum (petroleum jelly, "Vaseline") over the wound (unless you have an allergy to this). We recommend that you use a new, sterile tube of Vaseline. Do not pick or remove scabs. Do not remove the yellow or white "healing tissue" from the base of the wound.  Cover the wound with fresh, clean, nonstick gauze and secure with paper tape. You may use Band-Aids in place of gauze and tape if the wound is small enough, but would recommend trimming much of the tape off as there is often too much. Sometimes Band-Aids can irritate the skin.  You should call the office for your biopsy report after 1 week if you have not already been contacted.  If you experience any problems, such as abnormal amounts of bleeding, swelling, significant bruising, significant pain, or evidence of infection, please call the office immediately.  FOR ADULT SURGERY PATIENTS: If you need something for pain relief you may take 1 extra strength Tylenol (acetaminophen) AND 2 Ibuprofen (200mg each) together every 4 hours as needed for pain. (do not take these if you are allergic to them or if you have a reason you should not take them.) Typically, you may only need pain medication for 1 to 3 days.     Due to recent changes in healthcare laws, you may see results of your pathology and/or laboratory studies on MyChart before the doctors have had a chance to review them. We understand that in some cases there may be results that are confusing or concerning to you. Please understand that not all results are received at the same time and often the doctors may need to interpret multiple results in order to provide you with the best plan of care or course of treatment. Therefore, we ask that you please give us 2 business days to thoroughly review all your results before contacting the office for clarification. Should  we see a critical lab result, you will be contacted sooner.   If You Need Anything After Your Visit  If you have any questions or concerns for your doctor, please call our main line at [phone] and press option 4 to reach your doctor's medical assistant. If no one answers, please leave a voicemail as directed and we will return your call as soon as possible. Messages left after 4 pm will be answered the following business day.   You may also send us a message via MyChart. We typically respond to MyChart messages within 1-2 business days.  For prescription refills, please ask your pharmacy to contact our office. Our fax number is [phone].  If you have an urgent issue when the clinic is closed that cannot wait until the next business day, you can page your doctor at the number below.    Please note that while we do our best to be available for urgent issues outside of office hours, we are not available 24/7.   If you have an urgent issue and are unable to reach us, you may choose to seek medical care at your doctor's office, retail clinic, urgent care center, or emergency room.  If you have a medical emergency, please immediately call 911 or go to the emergency department.  Pager Numbers  - Dr. Kowalski: [phone]  - Dr. Moye: [phone]  - Dr. Stewart: [phone]  In the event of inclement weather, please call our main line at   [phone] for an update on the status of any delays or closures.  Dermatology Medication Tips: Please keep the boxes that topical medications come in in order to help keep track of the instructions about where and how to use these. Pharmacies typically print the medication instructions only on the boxes and not directly on the medication tubes.   If your medication is too expensive, please contact our office at [phone] option 4 or send us a message through MyChart.   We are unable to tell what your co-pay for medications will be in  advance as this is different depending on your insurance coverage. However, we may be able to find a substitute medication at lower cost or fill out paperwork to get insurance to cover a needed medication.   If a prior authorization is required to get your medication covered by your insurance company, please allow us 1-2 business days to complete this process.  Drug prices often vary depending on where the prescription is filled and some pharmacies may offer cheaper prices.  The website www.goodrx.com contains coupons for medications through different pharmacies. The prices here do not account for what the cost may be with help from insurance (it may be cheaper with your insurance), but the website can give you the price if you did not use any insurance.  - You can print the associated coupon and take it with your prescription to the pharmacy.  - You may also stop by our office during regular business hours and pick up a GoodRx coupon card.  - If you need your prescription sent electronically to a different pharmacy, notify our office through [facility] MyChart or by phone at [phone] option 4.     Si Usted Necesita Algo Despus de Su Visita  Tambin puede enviarnos un mensaje a travs de MyChart. Por lo general respondemos a los mensajes de MyChart en el transcurso de 1 a 2 das hbiles.  Para renovar recetas, por favor pida a su farmacia que se ponga en contacto con nuestra oficina. Nuestro nmero de fax es el [phone].  Si tiene un asunto urgente cuando la clnica est cerrada y que no puede esperar hasta el siguiente da hbil, puede llamar/localizar a su doctor(a) al nmero que aparece a continuacin.   Por favor, tenga en cuenta que aunque hacemos todo lo posible para estar disponibles para asuntos urgentes fuera del horario de oficina, no estamos disponibles las 24 horas del da, los 7 das de la semana.   Si tiene un problema urgente y no puede comunicarse con nosotros, puede  optar por buscar atencin mdica  en el consultorio de su doctor(a), en una clnica privada, en un centro de atencin urgente o en una sala de emergencias.  Si tiene una emergencia mdica, por favor llame inmediatamente al 911 o vaya a la sala de emergencias.  Nmeros de bper  - Dr. Kowalski: [phone]  - Dra. Moye: [phone]  - Dra. Stewart: [phone]  En caso de inclemencias del tiempo, por favor llame a nuestra lnea principal al [phone] para una actualizacin sobre el estado de cualquier retraso o cierre.  Consejos para la medicacin en dermatologa: Por favor, guarde las cajas en las que vienen los medicamentos de uso tpico para ayudarle a seguir las instrucciones sobre dnde y cmo usarlos. Las farmacias generalmente imprimen las instrucciones del medicamento slo en las cajas y no directamente en los tubos del medicamento.   Si su medicamento es muy caro, por favor, pngase en contacto con   nuestra oficina llamando al [phone] y presione la opcin 4 o envenos un mensaje a travs de MyChart.   No podemos decirle cul ser su copago por los medicamentos por adelantado ya que esto es diferente dependiendo de la cobertura de su seguro. Sin embargo, es posible que podamos encontrar un medicamento sustituto a menor costo o llenar un formulario para que el seguro cubra el medicamento que se considera necesario.   Si se requiere una autorizacin previa para que su compaa de seguros cubra su medicamento, por favor permtanos de 1 a 2 das hbiles para completar este proceso.  Los precios de los medicamentos varan con frecuencia dependiendo del lugar de dnde se surte la receta y alguna farmacias pueden ofrecer precios ms baratos.  El sitio web www.goodrx.com tiene cupones para medicamentos de diferentes farmacias. Los precios aqu no tienen en cuenta lo que podra costar con la ayuda del seguro (puede ser ms barato con su seguro), pero el sitio web puede darle el  precio si no utiliz ningn seguro.  - Puede imprimir el cupn correspondiente y llevarlo con su receta a la farmacia.  - Tambin puede pasar por nuestra oficina durante el horario de atencin regular y recoger una tarjeta de cupones de GoodRx.  - Si necesita que su receta se enve electrnicamente a una farmacia diferente, informe a nuestra oficina a travs de MyChart de [facility] o por telfono llamando al [phone] y presione la opcin 4.  

## 2021-12-23 NOTE — Progress Notes (Signed)
Follow-Up Visit   Subjective  Latasha Smith is a 36 y.o. female who presents for the following: Annual Exam. Hx of Dysplastic nevus. Patient would like to increase to Retin Al  The patient presents for Total-Body Skin Exam (TBSE) for skin cancer screening and mole check.  The patient has spots, moles and lesions to be evaluated, some may be new or changing and the patient has concerns that these could be cancer.   The following portions of the chart were reviewed this encounter and updated as appropriate:   Tobacco  Allergies  Meds  Problems  Med Hx  Surg Hx  Fam Hx     Review of Systems:  No other skin or systemic complaints except as noted in HPI or Assessment and Plan.  Objective  Well appearing patient in no apparent distress; mood and affect are within normal limits.  A full examination was performed including scalp, head, eyes, ears, nose, lips, neck, chest, axillae, abdomen, back, buttocks, bilateral upper extremities, bilateral lower extremities, hands, feet, fingers, toes, fingernails, and toenails. All findings within normal limits unless otherwise noted below.  face Face mainly clear   lower legs Hypopigmented patches   Left Lower Leg - Posterior 0.6 cm irregular brown macule         Assessment & Plan  Acne vulgaris face Chronic and persistent but improved.   Increase to  Tretinoin .01% cream apply to face at bedtime   tretinoin (RETIN-A) 0.1 % cream - face Apply topically at bedtime.  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 Condition is symptomatic / bothersome to patient. Not to goal.   Start Ketoconazole shampoo apply as a body wash on skin 2-3 days a week for 6 weeks   Related Medications ketoconazole (NIZORAL) 2 % shampoo Wash in the shower 2-3 times a week for 6 weeks  Neoplasm of skin Left Lower Leg - Posterior Epidermal / dermal shaving  Lesion diameter  (cm):  0.6 Informed consent: discussed and consent obtained   Timeout: patient name, date of birth, surgical site, and procedure verified   Procedure prep:  Patient was prepped and draped in usual sterile fashion Prep type:  Isopropyl alcohol Anesthesia: the lesion was anesthetized in a standard fashion   Anesthetic:  1% lidocaine w/ epinephrine 1-100,000 buffered w/ 8.4% NaHCO3 Hemostasis achieved with: pressure, aluminum chloride and electrodesiccation   Outcome: patient tolerated procedure well   Post-procedure details: sterile dressing applied and wound care instructions given   Dressing type: bandage and petrolatum    Specimen 1 - Surgical pathology Differential Diagnosis: R/O Dysplastic nevus   Check Margins: No  Lentigines - Scattered tan macules - Due to sun exposure - Benign-appearing, observe - Recommend daily broad spectrum sunscreen SPF 30+ to sun-exposed areas, reapply every 2 hours as needed. - Call for any changes  Seborrheic Keratoses - Stuck-on, waxy, tan-brown papules and/or plaques  - Benign-appearing - Discussed benign etiology and prognosis. - Observe - Call for any changes  Melanocytic Nevi - Tan-brown and/or pink-flesh-colored symmetric macules and papules - Benign appearing on exam today - Observation - Call clinic for new or changing moles - Recommend daily use of broad spectrum spf 30+ sunscreen to sun-exposed areas.   Hemangiomas - Red papules - Discussed benign nature - Observe - Call for any changes  Actinic Damage - Chronic condition, secondary to cumulative UV/sun exposure - diffuse scaly erythematous macules with underlying dyspigmentation - Recommend daily  broad spectrum sunscreen SPF 30+ to sun-exposed areas, reapply every 2 hours as needed.  - Staying in the shade or wearing long sleeves, sun glasses (UVA+UVB protection) and wide brim hats (4-inch brim around the entire circumference of the hat) are also recommended for sun  protection.  - Call for new or changing lesions.  Dermatofibroma Right lateral bra line  - Firm pink/brown papulenodule with dimple sign - Benign appearing - Call for any changes   History of Dysplastic Nevi Multiple see history - No evidence of recurrence today - Recommend regular full body skin exams - Recommend daily broad spectrum sunscreen SPF 30+ to sun-exposed areas, reapply every 2 hours as needed.  - Call if any new or changing lesions are noted between office visits  Skin cancer screening performed today.   Return in about 1 year (around 12/24/2022) for TBSE, hx of Dysplastic nevus .  IMarye Round, CMA, am acting as scribe for Sarina Ser, MD .  Documentation: I have reviewed the above documentation for accuracy and completeness, and I agree with the above.  Sarina Ser, MD

## 2021-12-29 ENCOUNTER — Telehealth: Payer: Self-pay

## 2021-12-29 NOTE — Telephone Encounter (Signed)
Left message for patient to call office for results/hd 

## 2021-12-29 NOTE — Telephone Encounter (Signed)
-----   Message from Ralene Bathe, MD sent at 12/28/2021  6:11 PM EST ----- Diagnosis Skin , left lower leg posterior COMBINED MELANOCYTIC NEVUS, BASE INVOLVED  Benign mole No further treatment needed

## 2021-12-31 ENCOUNTER — Encounter: Payer: Self-pay | Admitting: Dermatology

## 2022-01-09 ENCOUNTER — Telehealth: Payer: Self-pay

## 2022-01-09 NOTE — Telephone Encounter (Signed)
Left message for patient to call office/hd

## 2022-01-09 NOTE — Telephone Encounter (Signed)
-----   Message from Ralene Bathe, MD sent at 12/28/2021  6:11 PM EST ----- Diagnosis Skin , left lower leg posterior COMBINED MELANOCYTIC NEVUS, BASE INVOLVED  Benign mole No further treatment needed

## 2022-01-14 ENCOUNTER — Other Ambulatory Visit: Payer: Self-pay | Admitting: Family Medicine

## 2022-01-16 MED ORDER — LISDEXAMFETAMINE DIMESYLATE 40 MG PO CAPS: 40.0000 mg | ORAL_CAPSULE | Freq: Every day | ORAL | 0 refills | Status: DC

## 2022-01-16 NOTE — Addendum Note (Signed)
Addended by: Virginia Crews on: 01/16/2022 10:13 AM   Modules accepted: Orders

## 2022-01-20 ENCOUNTER — Telehealth: Payer: Self-pay | Admitting: Family Medicine

## 2022-01-20 ENCOUNTER — Other Ambulatory Visit: Payer: Self-pay | Admitting: Family Medicine

## 2022-01-20 ENCOUNTER — Other Ambulatory Visit: Payer: Self-pay

## 2022-01-20 MED ORDER — LISDEXAMFETAMINE DIMESYLATE 40 MG PO CAPS
40.0000 mg | ORAL_CAPSULE | Freq: Every day | ORAL | 0 refills | Status: DC
  Filled 2022-01-20: qty 30, 30d supply, fill #0

## 2022-01-20 NOTE — Telephone Encounter (Addendum)
Pt's husband called [facility] in [place] and they have  lisdexamfetamine (VYVANSE) 40 MG capsule in stock.  He would like it sent there instead. Please call and let him know when it has been sent. The pharmacy closes at 6 and they are trying to get her medication before the weekend because she has been out and unable to get due to it being out of stock.

## 2022-01-20 NOTE — Telephone Encounter (Signed)
Pt's husband called and stated that [facility] in [place] is out of lisdexamfetamine (VYVANSE) 40 MG capsule.  They told him that it is on backorder.  He called around and [facility] partial [facility] [place]  He states that they only have 20 there.  [facility] would [facility] [place].  Pt has been out of medication 4 or 5 days.

## 2022-01-31 ENCOUNTER — Telehealth: Payer: Self-pay

## 2022-01-31 NOTE — Telephone Encounter (Signed)
-----   Message from Ralene Bathe, MD sent at 12/28/2021  6:11 PM EST ----- Diagnosis Skin , left lower leg posterior COMBINED MELANOCYTIC NEVUS, BASE INVOLVED  Benign mole No further treatment needed

## 2022-01-31 NOTE — Telephone Encounter (Signed)
Lft pt msg to call for bx results/sh °

## 2022-02-14 ENCOUNTER — Other Ambulatory Visit: Payer: Self-pay | Admitting: Family Medicine

## 2022-02-14 ENCOUNTER — Other Ambulatory Visit: Payer: Self-pay

## 2022-02-16 ENCOUNTER — Other Ambulatory Visit: Payer: Self-pay

## 2022-02-17 ENCOUNTER — Encounter: Payer: Self-pay | Admitting: Family Medicine

## 2022-02-17 ENCOUNTER — Other Ambulatory Visit: Payer: Self-pay

## 2022-02-17 ENCOUNTER — Telehealth (INDEPENDENT_AMBULATORY_CARE_PROVIDER_SITE_OTHER): Payer: BC Managed Care – PPO | Admitting: Family Medicine

## 2022-02-17 DIAGNOSIS — F988 Other specified behavioral and emotional disorders with onset usually occurring in childhood and adolescence: Secondary | ICD-10-CM

## 2022-02-17 DIAGNOSIS — R41840 Attention and concentration deficit: Secondary | ICD-10-CM | POA: Diagnosis not present

## 2022-02-17 MED ORDER — LISDEXAMFETAMINE DIMESYLATE 40 MG PO CAPS
40.0000 mg | ORAL_CAPSULE | ORAL | 0 refills | Status: DC
  Filled 2022-02-17: qty 30, 30d supply, fill #0

## 2022-02-17 MED ORDER — LISDEXAMFETAMINE DIMESYLATE 40 MG PO CAPS
40.0000 mg | ORAL_CAPSULE | Freq: Every day | ORAL | 0 refills | Status: DC
  Filled 2022-02-17 – 2022-02-20 (×2): qty 30, 30d supply, fill #0

## 2022-02-17 NOTE — Assessment & Plan Note (Signed)
Chronic and well-controlled Failed short acting and XR Adderall previously Doing well on Vyvanse 40 mg daily-will continue Refilled x 3 months 

## 2022-02-17 NOTE — Progress Notes (Signed)
I,Sulibeya S Dimas,acting as a Education administrator for Latasha Paganini, MD.,have documented all relevant documentation on the behalf of Latasha Paganini, MD,as directed by  Latasha Paganini, MD while in the presence of Latasha Paganini, MD.   MyChart Video Visit    Virtual Visit via Video Note   This format is felt to be most appropriate for this patient at this time. Physical exam was limited by quality of the video and audio technology used for the visit.    Patient location: home Provider location: [place] [facility] involved in the visit: patient, provider   I discussed the limitations of evaluation and management by telemedicine and the availability of in person appointments. The patient expressed understanding and agreed to proceed.  Patient: Latasha Smith   DOB: [birth year]   37 y.o. Female  MRN: [id number] Visit Date: 02/17/2022  Today's healthcare provider: Lavon Paganini, MD   Chief Complaint  Patient presents with   Follow-up   Subjective    HPI  Follow up for ADD  The patient was last seen for this 8 months ago. Changes made at last visit include no changes.  She reports excellent compliance with treatment. She feels that condition is Unchanged. She is not having side effects.   -----------------------------------------------------------------------------------------    Medications: Outpatient Medications Prior to Visit  Medication Sig   ketoconazole (NIZORAL) 2 % shampoo Wash in the shower 2-3 times a week for 6 weeks   SUMAtriptan (IMITREX) 50 MG tablet Take 1 tablet (50 mg total) by mouth daily as needed for migraine. May repeat in 2 hours if headache persists or recurs.   tretinoin (RETIN-A) 0.1 % cream Apply topically at bedtime.   [DISCONTINUED] lisdexamfetamine (VYVANSE) 40 MG capsule Take 1 capsule (40 mg total) by mouth daily.   [DISCONTINUED] cephALEXin (KEFLEX) 500 MG capsule Take 1 capsule (500 mg total) by mouth 2 (two)  times daily.   No facility-administered medications prior to visit.    Review of Systems  Constitutional:  Negative for appetite change, fatigue and unexpected weight change.  Respiratory:  Negative for chest tightness and shortness of breath.   Cardiovascular:  Negative for chest pain.  Gastrointestinal:  Negative for abdominal pain, nausea and vomiting.  Psychiatric/Behavioral:  Negative for agitation, confusion and sleep disturbance. The patient is not nervous/anxious.        Objective    There were no vitals taken for this visit.     Physical Exam Constitutional:      General: She is not in acute distress.    Appearance: Normal appearance.  HENT:     Head: Normocephalic.  Pulmonary:     Effort: Pulmonary effort is normal. No respiratory distress.  Neurological:     Mental Status: She is alert and oriented to person, place, and time. Mental status is at baseline.        Assessment & Plan     Problem List Items Addressed This Visit       Other   Attention deficit disorder (ADD) without hyperactivity - Primary    Chronic and well-controlled Failed short acting and XR Adderall previously Doing well on Vyvanse 40 mg daily-will continue Refilled x 3 months        Meds ordered this encounter  Medications   lisdexamfetamine (VYVANSE) 40 MG capsule    Sig: Take 1 capsule (40 mg total) by mouth daily.    Dispense:  30 capsule    Refill:  0    Do not fill <30  days from last refill   lisdexamfetamine (VYVANSE) 40 MG capsule    Sig: Take 1 capsule (40 mg total) by mouth every morning.    Dispense:  30 capsule    Refill:  0    Do not fill <30 days from last refill   lisdexamfetamine (VYVANSE) 40 MG capsule    Sig: Take 1 capsule (40 mg total) by mouth every morning.    Dispense:  30 capsule    Refill:  0    Do not fill <30 days from last refill     Return in about 4 months (around 06/18/2022) for CPE.     I discussed the assessment and treatment plan with  the patient. The patient was provided an opportunity to ask questions and all were answered. The patient agreed with the plan and demonstrated an understanding of the instructions.   The patient was advised to call back or seek an in-person evaluation if the symptoms worsen or if the condition fails to improve as anticipated.  I, Latasha Paganini, MD, have reviewed all documentation for this visit. The documentation on 02/17/22 for the exam, diagnosis, procedures, and orders are all accurate and complete.   Lashea Goda, Dionne Bucy, MD, MPH [facility]

## 2022-02-20 ENCOUNTER — Other Ambulatory Visit: Payer: Self-pay

## 2022-02-21 ENCOUNTER — Telehealth: Payer: Self-pay

## 2022-02-21 NOTE — Telephone Encounter (Signed)
Advised pt of bx result/sh ?

## 2022-02-21 NOTE — Telephone Encounter (Signed)
-----   Message from Ralene Bathe, MD sent at 12/28/2021  6:11 PM EST ----- Diagnosis Skin , left lower leg posterior COMBINED MELANOCYTIC NEVUS, BASE INVOLVED  Benign mole No further treatment needed

## 2022-02-24 ENCOUNTER — Telehealth: Payer: BC Managed Care – PPO | Admitting: Nurse Practitioner

## 2022-02-24 DIAGNOSIS — H1033 Unspecified acute conjunctivitis, bilateral: Secondary | ICD-10-CM | POA: Diagnosis not present

## 2022-02-24 MED ORDER — POLYMYXIN B-TRIMETHOPRIM 10000-0.1 UNIT/ML-% OP SOLN: 1.0000 [drp] | Freq: Four times a day (QID) | OPHTHALMIC | 0 refills | Status: AC

## 2022-02-24 NOTE — Progress Notes (Signed)
E-Visit for Latasha Smith   We are sorry that you are not feeling well.  Here is how we plan to help!  Based on what you have shared with me it looks like you have conjunctivitis.  Conjunctivitis is a common inflammatory or infectious condition of the eye that is often referred to as "pink eye".  In most cases it is contagious (viral or bacterial). However, not all conjunctivitis requires antibiotics (ex. Allergic).  We have made appropriate suggestions for you based upon your presentation.  I have prescribed Polytrim Ophthalmic drops 1-2 drops 4 times a day times 5 days  Pink eye can be highly contagious.  It is typically spread through direct contact with secretions, or contaminated objects or surfaces that one may have touched.  Strict handwashing is suggested with soap and water is urged.  If not available, use alcohol based had sanitizer.  Avoid unnecessary touching of the eye.  If you wear contact lenses, you will need to refrain from wearing them until you see no white discharge from the eye for at least 24 hours after being on medication.  You should see symptom improvement in 1-2 days after starting the medication regimen.  Call us if symptoms are not improved in 1-2 days.  Home Care: Wash your hands often! Do not wear your contacts until you complete your treatment plan. Avoid sharing towels, bed linen, personal items with a person who has pink eye. See attention for anyone in your home with similar symptoms.  Get Help Right Away If: Your symptoms do not improve. You develop blurred or loss of vision. Your symptoms worsen (increased discharge, pain or redness)   Thank you for choosing an e-visit.  Your e-visit answers were reviewed by a board certified advanced clinical practitioner to complete your personal care plan. Depending upon the condition, your plan could have included both over the counter or prescription medications.  Please review your pharmacy choice. Make sure the  pharmacy is open so you can pick up prescription now. If there is a problem, you may contact your provider through [employer] and have the prescription routed to another pharmacy.  Your safety is important to [place]. If you have drug allergies check your prescription carefully.   For the next 24 hours you can use MyChart to ask questions about today's visit, request a non-urgent call back, or ask for a work or school excuse. You will get an email in the next two days asking about your experience. I hope that your e-visit has been valuable and will speed your recovery.  I spent approximately 5 minutes reviewing the patient's history, current symptoms and coordinating their care today.    Meds ordered this encounter  Medications   trimethoprim-polymyxin b (POLYTRIM) ophthalmic solution    Sig: Place 1 drop into both eyes 4 (four) times daily for 5 days.    Dispense:  10 mL    Refill:  0

## 2022-03-22 ENCOUNTER — Other Ambulatory Visit: Payer: Self-pay | Admitting: Family Medicine

## 2022-03-23 ENCOUNTER — Telehealth: Payer: BC Managed Care – PPO | Admitting: Physician Assistant

## 2022-03-23 ENCOUNTER — Other Ambulatory Visit: Payer: Self-pay

## 2022-03-23 DIAGNOSIS — H1033 Unspecified acute conjunctivitis, bilateral: Secondary | ICD-10-CM | POA: Diagnosis not present

## 2022-03-23 MED ORDER — LISDEXAMFETAMINE DIMESYLATE 40 MG PO CAPS
40.0000 mg | ORAL_CAPSULE | Freq: Every day | ORAL | 0 refills | Status: DC
  Filled 2022-03-23 – 2022-03-28 (×2): qty 30, 30d supply, fill #0

## 2022-03-23 MED ORDER — POLYMYXIN B-TRIMETHOPRIM 10000-0.1 UNIT/ML-% OP SOLN
OPHTHALMIC | 0 refills | Status: DC
  Filled 2022-03-23: qty 10, 5d supply, fill #0

## 2022-03-23 NOTE — Progress Notes (Signed)
I have spent 5 minutes in review of e-visit questionnaire, review and updating patient chart, medical decision making and response to patient.   Neville Walston Cody Jayvon Mounger, PA-C    

## 2022-03-23 NOTE — Progress Notes (Signed)
E-Visit for Latasha Smith   We are sorry that you are not feeling well.  Here is how we plan to help!  Based on what you have shared with me it looks like you have conjunctivitis.  Conjunctivitis is a common inflammatory or infectious condition of the eye that is often referred to as "pink eye".  In most cases it is contagious (viral or bacterial). However, not all conjunctivitis requires antibiotics (ex. Allergic).  We have made appropriate suggestions for you based upon your presentation.  I have prescribed Polytrim Ophthalmic drops 1-2 drops 4 times a day times 5 days and I recommend that you use OpconA, 1-2 drops every 4-6 hours (an over the counter allergy drop available at your local pharmacy).  Your pharmacist may have an alternative suggestion.  Giving this recurrence I honestly suspect underlying uncontrolled allergic inflammation. I want you to schedule an in-person follow-up with your PCP.   Pink eye can be highly contagious.  It is typically spread through direct contact with secretions, or contaminated objects or surfaces that one may have touched.  Strict handwashing is suggested with soap and water is urged.  If not available, use alcohol based had sanitizer.  Avoid unnecessary touching of the eye.  If you wear contact lenses, you will need to refrain from wearing them until you see no white discharge from the eye for at least 24 hours after being on medication.  You should see symptom improvement in 1-2 days after starting the medication regimen.  Call us if symptoms are not improved in 1-2 days.  Home Care: Wash your hands often! Do not wear your contacts until you complete your treatment plan. Avoid sharing towels, bed linen, personal items with a person who has pink eye. See attention for anyone in your home with similar symptoms.  Get Help Right Away If: Your symptoms do not improve. You develop blurred or loss of vision. Your symptoms worsen (increased discharge, pain or  redness)   Thank you for choosing an e-visit.  Your e-visit answers were reviewed by a board certified advanced clinical practitioner to complete your personal care plan. Depending upon the condition, your plan could have included both over the counter or prescription medications.  Please review your pharmacy choice. Make sure the pharmacy is open so you can pick up prescription now. If there is a problem, you may contact your provider through [employer] and have the prescription routed to another pharmacy.  Your safety is important to [place]. If you have drug allergies check your prescription carefully.   For the next 24 hours you can use MyChart to ask questions about today's visit, request a non-urgent call back, or ask for a work or school excuse. You will get an email in the next two days asking about your experience. I hope that your e-visit has been valuable and will speed your recovery.

## 2022-03-24 ENCOUNTER — Other Ambulatory Visit: Payer: Self-pay

## 2022-03-24 ENCOUNTER — Encounter: Payer: Self-pay | Admitting: Family Medicine

## 2022-03-27 ENCOUNTER — Other Ambulatory Visit: Payer: Self-pay

## 2022-03-27 MED ORDER — LISDEXAMFETAMINE DIMESYLATE 20 MG PO CAPS
40.0000 mg | ORAL_CAPSULE | Freq: Every day | ORAL | 0 refills | Status: DC
  Filled 2022-03-27 (×2): qty 60, 30d supply, fill #0

## 2022-03-28 ENCOUNTER — Other Ambulatory Visit: Payer: Self-pay

## 2022-03-28 MED ORDER — LISDEXAMFETAMINE DIMESYLATE 10 MG PO CAPS
10.0000 mg | ORAL_CAPSULE | Freq: Every day | ORAL | 0 refills | Status: DC
  Filled 2022-03-28 (×2): qty 30, 30d supply, fill #0

## 2022-03-28 MED ORDER — LISDEXAMFETAMINE DIMESYLATE 30 MG PO CAPS
30.0000 mg | ORAL_CAPSULE | Freq: Every day | ORAL | 0 refills | Status: DC
  Filled 2022-03-28: qty 30, 30d supply, fill #0

## 2022-03-28 NOTE — Addendum Note (Signed)
Addended by: Virginia Crews on: 03/28/2022 03:28 PM   Modules accepted: Orders

## 2022-03-29 ENCOUNTER — Other Ambulatory Visit: Payer: Self-pay

## 2022-04-10 NOTE — Progress Notes (Unsigned)
I,Aiyah Scarpelli S Lillar Bianca,acting as a Education administrator for Lavon Paganini, MD.,have documented all relevant documentation on the behalf of Lavon Paganini, MD,as directed by  Lavon Paganini, MD while in the presence of Lavon Paganini, MD.    Complete physical exam   Patient: Latasha Smith   DOB: 1985-04-23   37 y.o. Female  MRN: [id number] Visit Date: 04/11/2022  Today's healthcare provider: Lavon Paganini, MD   No chief complaint on file.  Subjective    Rahel Mottola is a 37 y.o. female who presents today for a complete physical exam.  She reports consuming a {diet types:17450} diet. {Exercise:19826} She generally feels {well/fairly well/poorly:18703}. She reports sleeping {well/fairly well/poorly:18703}. She {does/does not:200015} have additional problems to discuss today.  HPI  Tdap- Pap-  Past Medical History:  Diagnosis Date   Asthma    As a child   Chicken pox    Dysplastic nevus 12/13/2017   R post side - moderate   Dysplastic nevus 08/27/2018   R ant side - severe, excision   History of UTI    Migraine    Past Surgical History:  Procedure Laterality Date   NO PAST SURGERIES     Social History   Socioeconomic History   Marital status: Married    Spouse name: Mackensi Imig   Number of children: 3   Years of education: Not on file   Highest education level: Master's degree (e.g., MA, MS, MEng, MEd, MSW, MBA)  Occupational History   Occupation: Charity fundraiser: [employer]  Tobacco Use   Smoking status: Never   Smokeless tobacco: Never  Vaping Use   Vaping Use: Never used  Substance and Sexual Activity   Alcohol use: Yes    Alcohol/week: 3.0 standard drinks of alcohol    Types: 3 Glasses of wine per week   Drug use: No   Sexual activity: Yes    Partners: Male    Birth control/protection: Surgical    Comment: husband s/p vasectomy  Other Topics Concern   Not on file  Social History Narrative   Not on file    Social Determinants of Health   Financial Resource Strain: Low Risk  (04/02/2017)   Overall Financial Resource Strain (CARDIA)    Difficulty of Paying Living Expenses: Not hard at all  Food Insecurity: No Food Insecurity (04/02/2017)   Hunger Vital Sign    Worried About Running Out of Food in the Last Year: Never true    [place] in the Last Year: Never true  Transportation Needs: No Transportation Needs (04/02/2017)   PRAPARE - Hydrologist (Medical): No    Lack of Transportation (Non-Medical): No  Physical Activity: Inactive (04/02/2017)   Exercise Vital Sign    Days of Exercise per Week: 0 days    Minutes of Exercise per Session: 0 min  Stress: Not on file  Social Connections: Not on file  Intimate Partner Violence: Not on file   Family Status  Relation Name Status   PGF  (Not Specified)   MGM  (Not Specified)   MGF  Deceased   PGM  (Not Specified)   Mother  Alive   Father  Alive   Sister  Alive   Brother  Alive   Neg Hx  (Not Specified)   Family History  Problem Relation Age of Onset   Alcoholism Paternal Grandfather    Lung cancer Paternal Grandfather  smoker   Arthritis Maternal Grandmother    Depression Maternal Grandmother    Heart disease Maternal Grandfather 72   Hypertension Maternal Grandfather    Diabetes Maternal Grandfather    Melanoma Maternal Grandfather    Stroke Paternal Grandmother 59   Depression Paternal Grandmother    Depression Mother    Healthy Father    Healthy Sister    Healthy Brother    Colon cancer Neg Hx    Breast cancer Neg Hx    Ovarian cancer Neg Hx    Cervical cancer Neg Hx    No Known Allergies  Patient Care Team: Virginia Crews, MD as PCP - General (Family Medicine)   Medications: Outpatient Medications Prior to Visit  Medication Sig   ketoconazole (NIZORAL) 2 % shampoo Wash in the shower 2-3 times a week for 6 weeks   lisdexamfetamine (VYVANSE) 10 MG capsule Take 1  capsule (10 mg total) by mouth daily. Take with the '30mg'$  dose for a total of '40mg'$  daily   lisdexamfetamine (VYVANSE) 30 MG capsule Take 1 capsule (30 mg total) by mouth daily. Take with the '10mg'$  dose for a total of 40 mg daily   lisdexamfetamine (VYVANSE) 40 MG capsule Take 1 capsule (40 mg total) by mouth every morning.   lisdexamfetamine (VYVANSE) 40 MG capsule Take 1 capsule (40 mg total) by mouth every morning.   lisdexamfetamine (VYVANSE) 40 MG capsule Take 1 capsule (40 mg total) by mouth daily.   SUMAtriptan (IMITREX) 50 MG tablet Take 1 tablet (50 mg total) by mouth daily as needed for migraine. May repeat in 2 hours if headache persists or recurs.   tretinoin (RETIN-A) 0.1 % cream Apply topically at bedtime.   trimethoprim-polymyxin b (POLYTRIM) ophthalmic solution Apply 1-2 drops into affected eye QID x 5 days.   No facility-administered medications prior to visit.    Review of Systems  All other systems reviewed and are negative.   Last CBC Lab Results  Component Value Date   WBC 6.3 04/02/2017   HGB 13.2 04/02/2017   HCT 39.8 04/02/2017   MCV 89 04/02/2017   MCH 29.5 04/02/2017   RDW 13.8 04/02/2017   PLT 246 [id number]   Last metabolic panel Lab Results  Component Value Date   GLUCOSE 79 04/02/2017   NA 141 04/02/2017   K 3.9 04/02/2017   CL 104 04/02/2017   CO2 23 04/02/2017   BUN 16 04/02/2017   CREATININE 0.77 04/02/2017   GFRNONAA 105 04/02/2017   CALCIUM 9.4 04/02/2017   PROT 6.9 04/02/2017   ALBUMIN 4.6 04/02/2017   LABGLOB 2.3 04/02/2017   AGRATIO 2.0 04/02/2017   BILITOT 0.4 04/02/2017   ALKPHOS 41 04/02/2017   AST 13 04/02/2017   ALT 12 04/02/2017   ANIONGAP 8 07/14/2013   Last thyroid functions Lab Results  Component Value Date   TSH 1.970 04/02/2017      Objective    There were no vitals taken for this visit. BP Readings from Last 3 Encounters:  04/01/21 111/70  12/08/20 98/60  11/06/19 111/71   Wt Readings from Last 3 Encounters:   04/01/21 141 lb (64 kg)  12/08/20 132 lb 9.6 oz (60.1 kg)  11/06/19 129 lb (58.5 kg)       Physical Exam  ***  Last depression screening scores    02/17/2022    8:50 AM 06/16/2021    2:01 PM 04/01/2021    8:29 AM  PHQ 2/9 Scores  PHQ - 2 Score  0 0 0  PHQ- 9 Score 0 8 9   Last fall risk screening    04/01/2021    8:29 AM  Fall Risk   Falls in the past year? 0   Last Audit-C alcohol use screening    04/01/2021    8:29 AM  Alcohol Use Disorder Test (AUDIT)  1. How often do you have a drink containing alcohol? 2  2. How many drinks containing alcohol do you have on a typical day when you are drinking? 0  3. How often do you have six or more drinks on one occasion? 0  AUDIT-C Score 2   A score of 3 or more in women, and 4 or more in men indicates increased risk for alcohol abuse, EXCEPT if all of the points are from question 1   No results found for any visits on 04/11/22.  Assessment & Plan    Routine Health Maintenance and Physical Exam  Exercise Activities and Dietary recommendations  Goals   None     Immunization History  Administered Date(s) Administered   DTaP 12/30/1987, 02/25/1988, 04/21/1988, 05/10/1989, 04/12/1992   HIB (PRP-OMP) 01/08/1989   Hepatitis B 11/22/1998, 12/27/1998, 05/23/1999   IPV 12/30/1987, 02/25/1988, 05/10/1989, 04/12/1992   Influenza-Unspecified 11/18/2015   MMR 03/15/1989, 05/10/1989, 04/12/1992   Meningococcal Conjugate 08/21/2005   Td 08/13/1997   Tdap 09/07/2005, 03/14/2012    Health Maintenance  Topic Date Due   COVID-19 Vaccine (1) Never done   Hepatitis C Screening  Never done   PAP SMEAR-Modifier  02/14/2016   DTaP/Tdap/Td (8 - Td or Tdap) 03/14/2022   INFLUENZA VACCINE  05/14/2022 (Originally 09/13/2021)   HIV Screening  Completed   HPV VACCINES  Aged Out    Discussed health benefits of physical activity, and encouraged her to engage in regular exercise appropriate for her age and condition.  ***  No follow-ups on  file.     {provider attestation***:1}   Lavon Paganini, MD  [facility] [phone] (phone) [phone] (fax)  [place]

## 2022-04-11 ENCOUNTER — Other Ambulatory Visit: Payer: Self-pay

## 2022-04-11 ENCOUNTER — Encounter: Payer: Self-pay | Admitting: Family Medicine

## 2022-04-11 ENCOUNTER — Ambulatory Visit (INDEPENDENT_AMBULATORY_CARE_PROVIDER_SITE_OTHER): Payer: BC Managed Care – PPO | Admitting: Family Medicine

## 2022-04-11 VITALS — BP 114/73 | HR 73 | Temp 98.3°F | Resp 12 | Ht 64.0 in | Wt 124.6 lb

## 2022-04-11 DIAGNOSIS — Z23 Encounter for immunization: Secondary | ICD-10-CM | POA: Diagnosis not present

## 2022-04-11 DIAGNOSIS — J3089 Other allergic rhinitis: Secondary | ICD-10-CM

## 2022-04-11 DIAGNOSIS — H1033 Unspecified acute conjunctivitis, bilateral: Secondary | ICD-10-CM | POA: Insufficient documentation

## 2022-04-11 DIAGNOSIS — Z0000 Encounter for general adult medical examination without abnormal findings: Secondary | ICD-10-CM | POA: Diagnosis not present

## 2022-04-11 DIAGNOSIS — F988 Other specified behavioral and emotional disorders with onset usually occurring in childhood and adolescence: Secondary | ICD-10-CM | POA: Diagnosis not present

## 2022-04-11 MED ORDER — LISDEXAMFETAMINE DIMESYLATE 40 MG PO CAPS
40.0000 mg | ORAL_CAPSULE | Freq: Every day | ORAL | 0 refills | Status: DC
  Filled 2022-04-11 – 2022-07-03 (×2): qty 30, 30d supply, fill #0

## 2022-04-11 MED ORDER — POLYMYXIN B-TRIMETHOPRIM 10000-0.1 UNIT/ML-% OP SOLN
1.0000 [drp] | Freq: Four times a day (QID) | OPHTHALMIC | 0 refills | Status: DC
  Filled 2022-04-11: qty 10, 5d supply, fill #0

## 2022-04-11 MED ORDER — LISDEXAMFETAMINE DIMESYLATE 40 MG PO CAPS
40.0000 mg | ORAL_CAPSULE | ORAL | 0 refills | Status: DC
  Filled 2022-04-11 – 2022-05-29 (×2): qty 30, 30d supply, fill #0

## 2022-04-11 MED ORDER — LISDEXAMFETAMINE DIMESYLATE 40 MG PO CAPS
40.0000 mg | ORAL_CAPSULE | ORAL | 0 refills | Status: DC
  Filled 2022-04-11 – 2022-04-28 (×3): qty 30, 30d supply, fill #0

## 2022-04-11 NOTE — Assessment & Plan Note (Signed)
Recommend antihistamine and nasal steroid

## 2022-04-11 NOTE — Assessment & Plan Note (Signed)
Chronic and well-controlled Failed short acting and XR Adderall previously Doing well on Vyvanse 40 mg daily-will continue Refilled x 3 months

## 2022-04-11 NOTE — Assessment & Plan Note (Signed)
Recurrent issue Advised to throw away any eye makeup or other proudcts used when she previously had conjunctivitis Redose drops

## 2022-04-12 LAB — COMPREHENSIVE METABOLIC PANEL
ALT: 12 IU/L (ref 0–32)
AST: 16 IU/L (ref 0–40)
Albumin/Globulin Ratio: 2 (ref 1.2–2.2)
Albumin: 4.8 g/dL (ref 3.9–4.9)
Alkaline Phosphatase: 50 IU/L (ref 44–121)
BUN/Creatinine Ratio: 20 (ref 9–23)
BUN: 15 mg/dL (ref 6–20)
Bilirubin Total: 0.3 mg/dL (ref 0.0–1.2)
CO2: 25 mmol/L (ref 20–29)
Calcium: 9.6 mg/dL (ref 8.7–10.2)
Chloride: 102 mmol/L (ref 96–106)
Creatinine, Ser: 0.74 mg/dL (ref 0.57–1.00)
Globulin, Total: 2.4 g/dL (ref 1.5–4.5)
Glucose: 84 mg/dL (ref 70–99)
Potassium: 4.7 mmol/L (ref 3.5–5.2)
Sodium: 141 mmol/L (ref 134–144)
Total Protein: 7.2 g/dL (ref 6.0–8.5)
eGFR: 109 mL/min/{1.73_m2} (ref >59)

## 2022-04-12 LAB — LIPID PANEL WITH LDL/HDL RATIO
Cholesterol, Total: 160 mg/dL (ref 100–199)
HDL: 52 mg/dL (ref >39)
LDL Chol Calc (NIH): 96 mg/dL (ref 0–99)
LDL/HDL Ratio: 1.8 ratio (ref 0.0–3.2)
Triglycerides: 60 mg/dL (ref 0–149)
VLDL Cholesterol Cal: 12 mg/dL (ref 5–40)

## 2022-04-12 LAB — HEPATITIS C ANTIBODY: Hep C Virus Ab: NONREACTIVE

## 2022-04-18 ENCOUNTER — Encounter: Payer: BC Managed Care – PPO | Admitting: Family Medicine

## 2022-04-25 ENCOUNTER — Other Ambulatory Visit: Payer: Self-pay

## 2022-04-27 ENCOUNTER — Other Ambulatory Visit: Payer: Self-pay

## 2022-04-28 ENCOUNTER — Other Ambulatory Visit: Payer: Self-pay

## 2022-05-29 ENCOUNTER — Other Ambulatory Visit: Payer: Self-pay

## 2022-07-03 ENCOUNTER — Other Ambulatory Visit: Payer: Self-pay

## 2022-07-03 ENCOUNTER — Other Ambulatory Visit: Payer: Self-pay | Admitting: Family Medicine

## 2022-07-04 ENCOUNTER — Other Ambulatory Visit: Payer: Self-pay

## 2022-08-07 ENCOUNTER — Other Ambulatory Visit: Payer: Self-pay

## 2022-08-07 ENCOUNTER — Other Ambulatory Visit: Payer: Self-pay | Admitting: Family Medicine

## 2022-08-08 ENCOUNTER — Other Ambulatory Visit: Payer: Self-pay

## 2022-08-08 MED FILL — Lisdexamfetamine Dimesylate Cap 40 MG: ORAL | 30 days supply | Qty: 30 | Fill #0 | Status: CN

## 2022-08-09 ENCOUNTER — Other Ambulatory Visit: Payer: Self-pay

## 2022-08-10 ENCOUNTER — Encounter: Payer: Self-pay | Admitting: Family Medicine

## 2022-08-10 ENCOUNTER — Other Ambulatory Visit: Payer: Self-pay

## 2022-08-10 MED ORDER — LISDEXAMFETAMINE DIMESYLATE 30 MG PO CAPS
30.0000 mg | ORAL_CAPSULE | Freq: Every day | ORAL | 0 refills | Status: DC
  Filled 2022-08-10: qty 30, 30d supply, fill #0

## 2022-08-11 ENCOUNTER — Other Ambulatory Visit: Payer: Self-pay

## 2022-08-21 ENCOUNTER — Encounter: Payer: Self-pay | Admitting: Family Medicine

## 2022-08-21 ENCOUNTER — Telehealth (INDEPENDENT_AMBULATORY_CARE_PROVIDER_SITE_OTHER): Payer: BC Managed Care – PPO | Admitting: Family Medicine

## 2022-08-21 ENCOUNTER — Other Ambulatory Visit: Payer: Self-pay

## 2022-08-21 VITALS — Ht 64.0 in | Wt 120.0 lb

## 2022-08-21 DIAGNOSIS — F988 Other specified behavioral and emotional disorders with onset usually occurring in childhood and adolescence: Secondary | ICD-10-CM | POA: Diagnosis not present

## 2022-08-21 DIAGNOSIS — J0101 Acute recurrent maxillary sinusitis: Secondary | ICD-10-CM | POA: Diagnosis not present

## 2022-08-21 MED ORDER — AMOXICILLIN-POT CLAVULANATE 875-125 MG PO TABS
1.0000 | ORAL_TABLET | Freq: Two times a day (BID) | ORAL | 0 refills | Status: AC
  Filled 2022-08-21: qty 14, 7d supply, fill #0

## 2022-08-21 NOTE — Progress Notes (Signed)
MyChart Video Visit    Virtual Visit via Video Note   This format is felt to be most appropriate for this patient at this time. Physical exam was limited by quality of the video and audio technology used for the visit.    Patient location: home Provider location: [facility] Persons involved in the visit: patient, provider   I discussed the limitations of evaluation and management by telemedicine and the availability of in person appointments. The patient expressed understanding and agreed to proceed.  Patient: Latasha Smith   DOB: Apr 06, 1985   36 y.o. Female  MRN: [id number] Visit Date: 08/21/2022  Today's healthcare provider: Shirlee Latch, MD   Chief Complaint  Patient presents with   URI   Subjective    URI  This is a recurrent problem. The current episode started 1 to 4 weeks ago. The problem has been unchanged. There has been no fever. Associated symptoms include congestion, ear pain, headaches, rhinorrhea and sinus pain. Pertinent negatives include no coughing, nausea, sore throat, vomiting or wheezing. She has tried antihistamine (nasal sprays) for the symptoms. The treatment provided no relief.  --------------------------------------------------------------------------------------------------- Discussed the use of AI scribe software for clinical note transcription with the patient, who gave verbal consent to proceed.  History of Present Illness   The patient presents with recurrent sinus infections, characterized by heavy congestion, headaches, and pressure in the nose and head. The symptoms are severe enough to cause pressure in the ears when the infection is particularly bad. The patient reports difficulty breathing and has been using Breathe Right strips to alleviate the congestion during sleep. The patient has a history of allergies and is concerned that there may be an underlying issue, such as a deviated septum, causing the recurrent  infections. The patient requests a referral to an ENT specialist for further evaluation.  Current symptoms present >8 days.  The patient also has a history of ADD and is currently on Vyvanse. The patient reports that the medication is working well, but has had to switch to a lower dose (30mg ) due to a shortage of the 40mg  dose. The patient reports that the lower dose runs out sooner, but is otherwise effective. The patient is interested in returning to the 40mg  dose when available.       Medications: Outpatient Medications Prior to Visit  Medication Sig   ketoconazole (NIZORAL) 2 % shampoo Wash in the shower 2-3 times a week for 6 weeks   lisdexamfetamine (VYVANSE) 10 MG capsule Take 1 capsule (10 mg total) by mouth daily. Take with the 30mg  dose for a total of 40mg  daily   lisdexamfetamine (VYVANSE) 30 MG capsule Take 1 capsule (30 mg total) by mouth daily.   lisdexamfetamine (VYVANSE) 40 MG capsule Take 1 capsule (40 mg total) by mouth daily.   lisdexamfetamine (VYVANSE) 40 MG capsule Take 1 capsule (40 mg total) by mouth every morning.   tretinoin (RETIN-A) 0.1 % cream Apply topically at bedtime.   lisdexamfetamine (VYVANSE) 40 MG capsule Take 1 capsule (40 mg total) by mouth every morning.   lisdexamfetamine (VYVANSE) 40 MG capsule Take 1 capsule (40 mg total) by mouth every morning.   [DISCONTINUED] trimethoprim-polymyxin b (POLYTRIM) ophthalmic solution Place 1-2 drops into affected eyes 4 (four) times daily for 5 days.   No facility-administered medications prior to visit.    Review of Systems  Constitutional:  Negative for chills and fever.  HENT:  Positive for congestion, ear pain, postnasal drip, rhinorrhea, sinus  pressure and sinus pain. Negative for sore throat.   Respiratory:  Negative for cough, chest tightness, shortness of breath and wheezing.   Gastrointestinal:  Negative for nausea and vomiting.  Neurological:  Positive for headaches.       Objective    Ht 5\' 4"   (1.626 m)   Wt 120 lb (54.4 kg)   LMP 08/09/2022 (Approximate)   BMI 20.60 kg/m      Physical Exam Constitutional:      General: She is not in acute distress.    Appearance: Normal appearance.  HENT:     Head: Normocephalic.  Pulmonary:     Effort: Pulmonary effort is normal. No respiratory distress.  Neurological:     Mental Status: She is alert and oriented to person, place, and time. Mental status is at baseline.        Assessment & Plan     Problem List Items Addressed This Visit       Other   Attention deficit disorder (ADD) without hyperactivity    Vyvanse 40mg  is effective, but patient had to switch to 30mg  due to supply issues. Patient reports that 30mg  wears off sooner but is otherwise effective. -Continue Vyvanse 40mg  as the ideal dose. Patient to use 30mg  as backup during supply shortages. -Check with pharmacy regarding availability of Vyvanse 40mg .      Other Visit Diagnoses     Acute recurrent maxillary sinusitis    -  Primary   Relevant Medications   amoxicillin-clavulanate (AUGMENTIN) 875-125 MG tablet   Other Relevant Orders   Ambulatory referral to ENT           Recurrent Sinusitis: Chronic congestion, headaches, and pressure in the head and ears. Patient has a history of allergies and is currently using over-the-counter nasal sprays with limited relief. Patient suspects a possible deviated septum. -Start Augmentin twice a day with food for seven days. -Refer to [facility] for further evaluation and management. -Advise use of Flonase or Nasonex regularly for symptom control. Cautioned against prolonged use of Afrin due to risk of rebound congestion.  General Health Maintenance -Next physical due in February.        Meds ordered this encounter  Medications   amoxicillin-clavulanate (AUGMENTIN) 875-125 MG tablet    Sig: Take 1 tablet by mouth 2 (two) times daily for 7 days.    Dispense:  14 tablet    Refill:  0     Return in about 7  months (around 03/24/2023) for CPE.     I discussed the assessment and treatment plan with the patient. The patient was provided an opportunity to ask questions and all were answered. The patient agreed with the plan and demonstrated an understanding of the instructions.   The patient was advised to call back or seek an in-person evaluation if the symptoms worsen or if the condition fails to improve as anticipated.   I, Shirlee Latch, MD, have reviewed all documentation for this visit. The documentation on 08/21/22 for the exam, diagnosis, procedures, and orders are all accurate and complete.   Latasha Smith, Latasha Schlein, MD, MPH [facility]

## 2022-08-21 NOTE — Assessment & Plan Note (Signed)
Vyvanse 40mg  is effective, but patient had to switch to 30mg  due to supply issues. Patient reports that 30mg  wears off sooner but is otherwise effective. -Continue Vyvanse 40mg  as the ideal dose. Patient to use 30mg  as backup during supply shortages. -Check with pharmacy regarding availability of Vyvanse 40mg .

## 2022-08-28 ENCOUNTER — Other Ambulatory Visit: Payer: Self-pay

## 2022-08-31 ENCOUNTER — Other Ambulatory Visit: Payer: Self-pay

## 2022-08-31 MED FILL — Lisdexamfetamine Dimesylate Cap 40 MG: ORAL | 30 days supply | Qty: 30 | Fill #0 | Status: AC

## 2022-09-30 ENCOUNTER — Other Ambulatory Visit: Payer: Self-pay | Admitting: Family Medicine

## 2022-10-02 ENCOUNTER — Other Ambulatory Visit: Payer: Self-pay

## 2022-10-02 MED ORDER — LISDEXAMFETAMINE DIMESYLATE 40 MG PO CAPS
40.0000 mg | ORAL_CAPSULE | Freq: Every morning | ORAL | 0 refills | Status: DC
  Filled 2022-10-02: qty 30, 30d supply, fill #0

## 2022-10-17 ENCOUNTER — Encounter: Payer: Self-pay | Admitting: Family Medicine

## 2022-10-23 ENCOUNTER — Telehealth (INDEPENDENT_AMBULATORY_CARE_PROVIDER_SITE_OTHER): Payer: BC Managed Care – PPO | Admitting: Family Medicine

## 2022-10-23 ENCOUNTER — Encounter: Payer: Self-pay | Admitting: Family Medicine

## 2022-10-23 VITALS — Ht 64.0 in | Wt 121.0 lb

## 2022-10-23 DIAGNOSIS — N6321 Unspecified lump in the left breast, upper outer quadrant: Secondary | ICD-10-CM

## 2022-10-23 DIAGNOSIS — N644 Mastodynia: Secondary | ICD-10-CM | POA: Diagnosis not present

## 2022-10-23 NOTE — Progress Notes (Signed)
MyChart Video Visit    Virtual Visit via Video Note   This format is felt to be most appropriate for this patient at this time. Physical exam was limited by quality of the video and audio technology used for the visit.    Patient location: home Provider location: [facility] Persons involved in the visit: patient, provider   I discussed the limitations of evaluation and management by telemedicine and the availability of in person appointments. The patient expressed understanding and agreed to proceed.  Patient: Latasha Smith   DOB: 03/15/1985   37 y.o. Female  MRN: [id number] Visit Date: 10/23/2022  Today's healthcare provider: Shirlee Latch, MD   Chief Complaint  Patient presents with   Breast Pain    Patient reports pain in her left breast X 2-3 weeks. Associated with swelling. No radiating. Pain located on the outer region of left breast, not under arm pit but right in front of it.    Subjective    HPI HPI     Breast Pain    Additional comments: Patient reports pain in her left breast X 2-3 weeks. Associated with swelling. No radiating. Pain located on the outer region of left breast, not under arm pit but right in front of it.       Last edited by Acey Lav, CMA on 10/23/2022  1:04 PM.      Discussed the use of AI scribe software for clinical note transcription with the patient, who gave verbal consent to proceed.  History of Present Illness   The patient, with no significant past medical history, presents with a three-week history of left breast pain and a palpable lump. The pain, initially attributed to premenstrual symptoms, has persisted longer than usual and is localized to the same spot. The patient and her spouse have palpated a lump in the same area. The patient denies any family history of breast cancer.        Medications: Outpatient Medications Prior to Visit  Medication Sig   ketoconazole (NIZORAL) 2 % shampoo Wash  in the shower 2-3 times a week for 6 weeks   lisdexamfetamine (VYVANSE) 10 MG capsule Take 1 capsule (10 mg total) by mouth daily. Take with the 30mg  dose for a total of 40mg  daily   lisdexamfetamine (VYVANSE) 30 MG capsule Take 1 capsule (30 mg total) by mouth daily.   lisdexamfetamine (VYVANSE) 40 MG capsule Take 1 capsule (40 mg total) by mouth every morning.   lisdexamfetamine (VYVANSE) 40 MG capsule Take 1 capsule (40 mg total) by mouth every morning.   lisdexamfetamine (VYVANSE) 40 MG capsule Take 1 capsule (40 mg total) by mouth daily.   lisdexamfetamine (VYVANSE) 40 MG capsule Take 1 capsule (40 mg total) by mouth every morning.   tretinoin (RETIN-A) 0.1 % cream Apply topically at bedtime.   No facility-administered medications prior to visit.    Review of Systems      Objective    Ht 5\' 4"  (1.626 m)   Wt 121 lb (54.9 kg)   BMI 20.77 kg/m       Physical Exam Constitutional:      General: She is not in acute distress.    Appearance: Normal appearance.  HENT:     Head: Normocephalic.  Pulmonary:     Effort: Pulmonary effort is normal. No respiratory distress.  Neurological:     Mental Status: She is alert and oriented to person, place, and time. Mental status is at baseline.  Assessment & Plan     Problem List Items Addressed This Visit   None Visit Diagnoses     Breast pain, left    -  Primary   Relevant Orders   [place] LIMITED ULTRASOUND INCLUDING AXILLA LEFT BREAST    MM 3D DIAGNOSTIC MAMMOGRAM UNILATERAL LEFT BREAST   Mass of upper outer quadrant of left breast       Relevant Orders   [place] LIMITED ULTRASOUND INCLUDING AXILLA LEFT BREAST    MM 3D DIAGNOSTIC MAMMOGRAM UNILATERAL LEFT BREAST           Left Breast Pain and Palpable Mass New onset pain and palpable mass in the left breast at the 3 o'clock position, near the armpit. No family history of breast cancer. -Order diagnostic mammogram and ultrasound of the left breast at [facility]. -If imaging is normal, plan for routine mammogram starting at age 35.  General Health Maintenance -Discussed the importance of routine screening mammograms starting at age 35 for average risk individuals.        Return if symptoms worsen or fail to improve.     I discussed the assessment and treatment plan with the patient. The patient was provided an opportunity to ask questions and all were answered. The patient agreed with the plan and demonstrated an understanding of the instructions.   The patient was advised to call back or seek an in-person evaluation if the symptoms worsen or if the condition fails to improve as anticipated.   Shirlee Latch, MD [facility] [phone] (phone) [phone] (fax)  [facility]

## 2022-11-02 ENCOUNTER — Other Ambulatory Visit: Payer: Self-pay | Admitting: Family Medicine

## 2022-11-02 ENCOUNTER — Other Ambulatory Visit: Payer: Self-pay

## 2022-11-03 ENCOUNTER — Other Ambulatory Visit: Payer: Self-pay

## 2022-11-06 NOTE — Telephone Encounter (Signed)
Requested medications are due for refill today.  yes  Requested medications are on the active medications list.  yes  Last refill. 10/02/2022   Future visit scheduled.   no  Notes to clinic.  Refill not delegated.    Requested Prescriptions  Pending Prescriptions Disp Refills   lisdexamfetamine (VYVANSE) 40 MG capsule 30 capsule 0    Sig: Take 1 capsule (40 mg total) by mouth every morning.     Not Delegated - Psychiatry:  Stimulants/ADHD Failed - 11/02/2022  5:54 PM      Failed - This refill cannot be delegated      Failed - Urine Drug Screen completed in last 360 days      Passed - Last BP in normal range    BP Readings from Last 1 Encounters:  04/11/22 114/73         Passed - Last Heart Rate in normal range    Pulse Readings from Last 1 Encounters:  04/11/22 73         Passed - Valid encounter within last 6 months    Recent Outpatient Visits           2 weeks ago Breast pain, left   [facility] [place], Marzella Schlein, MD   2 months ago Acute recurrent maxillary sinusitis   [place] [facility] [place], Marzella Schlein, MD   6 months ago Encounter for annual health examination   [facility] [place], Marzella Schlein, MD   8 months ago Attention deficit disorder (ADD) without hyperactivity   [place] [facility], Marzella Schlein, MD   1 year ago Attention deficit disorder (ADD) without hyperactivity   [place] [facility], Marzella Schlein, MD       Future Appointments             In 1 month Deirdre Evener, MD [facility]

## 2022-11-07 ENCOUNTER — Other Ambulatory Visit: Payer: Self-pay

## 2022-11-07 MED FILL — Lisdexamfetamine Dimesylate Cap 40 MG: ORAL | 30 days supply | Qty: 30 | Fill #0 | Status: AC

## 2022-11-08 ENCOUNTER — Other Ambulatory Visit: Payer: Self-pay

## 2022-12-20 ENCOUNTER — Other Ambulatory Visit: Payer: Self-pay

## 2022-12-20 ENCOUNTER — Telehealth: Payer: Self-pay

## 2022-12-20 ENCOUNTER — Encounter: Payer: Self-pay | Admitting: Dermatology

## 2022-12-20 ENCOUNTER — Ambulatory Visit: Payer: BC Managed Care – PPO | Admitting: Dermatology

## 2022-12-20 DIAGNOSIS — Z7189 Other specified counseling: Secondary | ICD-10-CM

## 2022-12-20 DIAGNOSIS — L700 Acne vulgaris: Secondary | ICD-10-CM | POA: Diagnosis not present

## 2022-12-20 DIAGNOSIS — Z79899 Other long term (current) drug therapy: Secondary | ICD-10-CM | POA: Diagnosis not present

## 2022-12-20 MED ORDER — DOXYCYCLINE MONOHYDRATE 50 MG PO CAPS
50.0000 mg | ORAL_CAPSULE | Freq: Every day | ORAL | 1 refills | Status: DC
  Filled 2022-12-20: qty 30, 30d supply, fill #0

## 2022-12-20 NOTE — Patient Instructions (Signed)

## 2022-12-20 NOTE — Telephone Encounter (Signed)
Left pt message to call about her IPLEDGE registration.  After speaking to [facility] help desk patient had been registered by a Dr. Lorel Monaco in [place].  Ipledge has sent patient link to sign on and change prescribing provider to Dr. Gwen Pounds.  Patient will need to do this before we can do anything in [facility] program./sh

## 2022-12-20 NOTE — Telephone Encounter (Signed)
Spoke to patient and advised she would have to go on the Ipledge link they sent her and transfer prescribers to Dr. Gwen Pounds.  Pt advised she got the email and she would go and do that now./sh

## 2022-12-20 NOTE — Progress Notes (Signed)
Follow-Up Visit   Subjective  Latasha Smith is a 37 y.o. female who presents for the following: Acne face, back, Tretinoin 0.1% cr qhs and still flaring, has tried in past Doxycycline, Epiduo Forte, Clindamycin, [place], [place] The patient has spots, moles and lesions to be evaluated, some may be new or changing and the patient may have concern these could be cancer.   The following portions of the chart were reviewed this encounter and updated as appropriate: medications, allergies, medical history  Review of Systems:  No other skin or systemic complaints except as noted in HPI or Assessment and Plan.  Objective  Well appearing patient in no apparent distress; mood and affect are within normal limits.   A focused examination was performed of the following areas: Face, back  Relevant exam findings are noted in the Assessment and Plan.    Assessment & Plan   ACNE VULGARIS Face, back Exam: paps and crusting shoulders, moderate paps and spotty hyperpigmented  macules face, photos show signs of papular activity over the month  Chronic and persistent condition with duration or expected duration over one year. Condition is bothersome/symptomatic for patient. Currently flared.   Treatment Plan: Discussed Isotretinoin and s/e Isotretinoin Counseling; Review and Contraception Counseling: Reviewed potential side effects of isotretinoin including xerosis, cheilitis, hepatitis, hyperlipidemia, and severe birth defects if taken by a pregnant woman.  Women on isotretinoin must be celibate (not having sex) or required to use at least 2 birth control methods to prevent pregnancy (unless patient is a female of non-child bearing potential).  Females of child-bearing potential must have monthly pregnancy tests while on isotretinoin and report through I-Pledge (FDA monitoring program). Reviewed reports of suicidal ideation in those with a history of depression while taking  isotretinoin and reports of diagnosis of inflammatory bowl disease (IBD) while taking isotretinoin as well as the lack of evidence for a causal relationship between isotretinoin, depression and IBD. Patient advised to reach out with any questions or concerns. Patient advised not to share pills or donate blood while on treatment or for one month after completing treatment. All patient's considering Isotretinoin must read and understand and sign Isotretinoin Consent Form and be registered with I-Pledge.   Pts husband has had a vasectomy, she has had 1 ovary removed  2 forms of Birth Control - Vasectomy, condoms Ipledge [phone] (patient was registered in [facility] on 12/01/22 by Dr. Lorel Monaco in [place].  Patient will need to transfer care to office in [facility] program.)  Urine pregnancy test performed in office today and was negative.  Patient demonstrates comprehension and confirms she will not get pregnant.  [id number] ex[ 03/17/2024  Patient Registered in [facility] program 12/01/22 by another provider, pt will need to transfer care to our office in [facility] program. Consents signed.  Will plan to start Isotretinoin pending 2 negative pregnancy test a month apart and labs    Start Doxycycline 50mg  1 po qd with evening meal (will do this until she starts Isotretinoin) Cont Tretinoin 0.1% cr qhs until she starts Isotretinoin  Doxycycline should be taken with food to prevent nausea. Do not lay down for 30 minutes after taking. Be cautious with sun exposure and use good sun protection while on this medication. Pregnant women should not take this medication.    Topical retinoid medications like tretinoin/Retin-A, adapalene/Differin, tazarotene/Fabior, and Epiduo/Epiduo Forte can cause dryness and irritation when first started. Only apply a pea-sized amount to the entire affected area. Avoid applying it around the  eyes, edges of mouth and creases at the nose. If you experience irritation, use a  good moisturizer first and/or apply the medicine less often. If you are doing well with the medicine, you can increase how often you use it until you are applying every night. Be careful with sun protection while using this medication as it can make you sensitive to the sun. This medicine should not be used by pregnant women.    Long term medication management.  Patient is using long term (months to years) prescription medication  to control their dermatologic condition.  These medications require periodic monitoring to evaluate for efficacy and side effects and may require periodic laboratory monitoring.  Acne vulgaris  Related Medications tretinoin (RETIN-A) 0.1 % cream Apply topically at bedtime.  Medication management  Counseling and coordination of care    Return in about 1 month (around 01/19/2023) for Isotretinoin start.  I, Ardis Rowan, RMA, am acting as scribe for Armida Sans, MD .   Documentation: I have reviewed the above documentation for accuracy and completeness, and I agree with the above.  Armida Sans, MD

## 2023-01-02 ENCOUNTER — Other Ambulatory Visit: Payer: Self-pay

## 2023-01-04 ENCOUNTER — Ambulatory Visit: Payer: BC Managed Care – PPO | Admitting: Dermatology

## 2023-01-04 ENCOUNTER — Encounter: Payer: Self-pay | Admitting: Dermatology

## 2023-01-04 VITALS — Wt 130.0 lb

## 2023-01-04 DIAGNOSIS — K130 Diseases of lips: Secondary | ICD-10-CM

## 2023-01-04 DIAGNOSIS — Z7189 Other specified counseling: Secondary | ICD-10-CM

## 2023-01-04 DIAGNOSIS — L853 Xerosis cutis: Secondary | ICD-10-CM

## 2023-01-04 DIAGNOSIS — L700 Acne vulgaris: Secondary | ICD-10-CM | POA: Diagnosis not present

## 2023-01-04 DIAGNOSIS — Z79899 Other long term (current) drug therapy: Secondary | ICD-10-CM

## 2023-01-04 MED ORDER — ISOTRETINOIN 20 MG PO CAPS: 20.0000 mg | ORAL_CAPSULE | Freq: Every day | ORAL | 0 refills | Status: DC

## 2023-01-04 NOTE — Progress Notes (Signed)
Isotretinoin Follow-Up Visit   Subjective  Latasha Smith is a 37 y.o. female who presents for the following: Isotretinoin start.   Patient is not pregnant, not seeking pregnancy, and not breastfeeding.   The following portions of the chart were reviewed this encounter and updated as appropriate: medications, allergies, medical history  Review of Systems:  No other skin or systemic complaints except as noted in HPI or Assessment and Plan.  Objective  Well appearing patient in no apparent distress; mood and affect are within normal limits.  An examination of the face, neck, chest, and back was performed and relevant findings are noted below.     Assessment & Plan   Acne vulgaris    ACNE VULGARIS Patient is starting Isotretinoin requiring FDA mandated monthly evaluations and laboratory monitoring. Condition is currently not to goal (must reach target dose based on weight and also have clear skin for 2 months prior to discontinuation in order to help prevent relapse)  Exam findings: Paps and crusting shoulders, moderate paps and spotty hyperpigmented  macules face.   Pharmacy [place] # [phone]  Birth Control- Husband has vasectomy, Condoms  Labs reviewed from 04/11/22 and 11/30/22, wnl Urine pregnancy test performed in office today and was negative.  Patient demonstrates comprehension and confirms she will not get pregnant.   Patient confirmed in iPLEDGE program.   Start isotretinoin 20 MG take 1 po every day with food dsp #30 0Rf.  Isotretinoin Counseling; Review and Contraception Counseling: Reviewed potential side effects of isotretinoin including xerosis, cheilitis, hepatitis, hyperlipidemia, and severe birth defects if taken by a pregnant woman.  Women on isotretinoin must be celibate (not having sex) or required to use at least 2 birth control methods to prevent pregnancy (unless patient is a female of non-child bearing potential).  Females of  child-bearing potential must have monthly pregnancy tests while on isotretinoin and report through I-Pledge (FDA monitoring program). Reviewed reports of suicidal ideation in those with a history of depression while taking isotretinoin and reports of diagnosis of inflammatory bowl disease (IBD) while taking isotretinoin as well as the lack of evidence for a causal relationship between isotretinoin, depression and IBD. Patient advised to reach out with any questions or concerns. Patient advised not to share pills or donate blood while on treatment or for one month after completing treatment. All patient's considering Isotretinoin must read and understand and sign Isotretinoin Consent Form and be registered with I-Pledge.   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 Clayborne Artist Cortibalm recommended   Long term medication management (isotretinoin)  Patient is using long term (months to years) prescription medication  to control their dermatologic condition.  These medications require periodic monitoring to evaluate for efficacy and side effects and may require periodic laboratory monitoring.  - While taking Isotretinoin and for 30 days after you finish the medication, do not get pregnant, do not share pills, do not donate blood. Isotretinoin is best absorbed when taken with a fatty meal. Isotretinoin can make you sensitive to the sun. Daily careful sun protection including sunscreen SPF 30+ when outdoors is recommended.  Follow-up in 30 days.  Wendee Beavers, CMA, am acting as scribe for Armida Sans, MD .   Documentation: I have reviewed the above documentation for accuracy and completeness, and I agree  with the above.  Armida Sans, MD

## 2023-01-04 NOTE — Patient Instructions (Addendum)
Your prescription was sent to [facility] in [place]. A representative from [facility] will contact you within 3 business hours to verify your address and insurance information to schedule a free delivery. If for any reason you do not receive a phone call from them, please reach out to them. Their phone number is [phone] and their hours are Monday-Friday 9:00 am-5:00 pm.    Isotretinoin Counseling; Review and Contraception Counseling: Reviewed potential side effects of isotretinoin including xerosis, cheilitis, hepatitis, hyperlipidemia, and severe birth defects if taken by a pregnant woman.  Women on isotretinoin must be celibate (not having sex) or required to use at least 2 birth control methods to prevent pregnancy (unless patient is a female of non-child bearing potential).  Females of child-bearing potential must have monthly pregnancy tests while on isotretinoin and report through I-Pledge (FDA monitoring program). Reviewed reports of suicidal ideation in those with a history of depression while taking isotretinoin and reports of diagnosis of inflammatory bowl disease (IBD) while taking isotretinoin as well as the lack of evidence for a causal relationship between isotretinoin, depression and IBD. Patient advised to reach out with any questions or concerns. Patient advised not to share pills or donate blood while on treatment or for one month after completing treatment. All patient's considering Isotretinoin must read and understand and sign Isotretinoin Consent Form and be registered with I-Pledge.   Due to recent changes in healthcare laws, you may see results of your pathology and/or laboratory studies on MyChart before the doctors have had a chance to review them. We understand that in some cases there may be results that are confusing or concerning to you. Please understand that not all results are received at the same time and often the doctors may need to interpret multiple  results in order to provide you with the best plan of care or course of treatment. Therefore, we ask that you please give [place] 2 business days to thoroughly review all your results before contacting the office for clarification. Should we see a critical lab result, you will be contacted sooner.   If You Need Anything After Your Visit  If you have any questions or concerns for your doctor, please call our main line at [phone] and press option 4 to reach your doctor's medical assistant. If no one answers, please leave a voicemail as directed and we will return your call as soon as possible. Messages left after 4 pm will be answered the following business day.   You may also send [place] a message via MyChart. We typically respond to MyChart messages within 1-2 business days.  For prescription refills, please ask your pharmacy to contact our office. Our fax number is [phone].  If you have an urgent issue when the clinic is closed that cannot wait until the next business day, you can page your doctor at the number below.    Please note that while we do our best to be available for urgent issues outside of office hours, we are not available 24/7.   If you have an urgent issue and are unable to reach [place], you may choose to seek medical care at your doctor's office, retail clinic, urgent care center, or emergency room.  If you have a medical emergency, please immediately call 911 or go to the emergency department.  Pager Numbers  - Dr. Gwen Pounds: [phone]  - Dr. Roseanne Reno: [phone]  - Dr. Katrinka Blazing: [phone]   In the event of inclement weather, please call our main line  at [phone] for an update on the status of any delays or closures.  Dermatology Medication Tips: Please keep the boxes that topical medications come in in order to help keep track of the instructions about where and how to use these. Pharmacies typically print the medication instructions only on the boxes and not  directly on the medication tubes.   If your medication is too expensive, please contact our office at [phone] option 4 or send [place] a message through MyChart.   We are unable to tell what your co-pay for medications will be in advance as this is different depending on your insurance coverage. However, we may be able to find a substitute medication at lower cost or fill out paperwork to get insurance to cover a needed medication.   If a prior authorization is required to get your medication covered by your insurance company, please allow [place] 1-2 business days to complete this process.  Drug prices often vary depending on where the prescription is filled and some pharmacies may offer cheaper prices.  The website www.goodrx.com contains coupons for medications through different pharmacies. The prices here do not account for what the cost may be with help from insurance (it may be cheaper with your insurance), but the website can give you the price if you did not use any insurance.  - You can print the associated coupon and take it with your prescription to the pharmacy.  - You may also stop by our office during regular business hours and pick up a GoodRx coupon card.  - If you need your prescription sent electronically to a different pharmacy, notify our office through [facility] or by phone at [phone] option 4.     Si Usted Necesita Algo Despus de Su Visita  Tambin puede enviarnos un mensaje a travs de Clinical cytogeneticist. Por lo general respondemos a los mensajes de MyChart en el transcurso de 1 a 2 das hbiles.  Para renovar recetas, por favor pida a su farmacia que se ponga en contacto con nuestra oficina. Annie Sable de fax es [place] [phone].  Si tiene un asunto urgente cuando la clnica est cerrada y que no puede esperar hasta el siguiente da hbil, puede llamar/localizar a su doctor(a) al nmero que aparece a continuacin.   Por favor, tenga en cuenta que aunque hacemos  todo lo posible para estar disponibles para asuntos urgentes fuera del horario de [place], no estamos disponibles las 24 horas del da, los 7 [address]  [address] de la [place].   Si tiene un problema urgente y no puede comunicarse con nosotros, puede optar por buscar atencin mdica  en el consultorio de su doctor(a), en una clnica privada, en un centro de atencin urgente o en una sala de emergencias.  Si tiene Engineer, drilling, por favor llame inmediatamente al 911 o vaya a la sala de emergencias.  Nmeros de bper  - Dr. Gwen Pounds: [phone]  - Dra. Roseanne Reno: [phone]  - Dr. Katrinka Blazing: [phone]   En caso de inclemencias del tiempo, por favor llame a Lacy Duverney principal al [phone] para una actualizacin sobre el [place] de cualquier retraso o cierre.  Consejos para la medicacin en dermatologa: Por favor, guarde las cajas en las que vienen los medicamentos de uso tpico para ayudarle a seguir las instrucciones sobre dnde y cmo usarlos. Las farmacias generalmente imprimen las instrucciones del medicamento slo en las cajas y no directamente en los tubos del [place].   Si su medicamento es [employer], por favor, pngase en  contacto con Rolm Gala llamando al [phone] y presione la opcin 4 o envenos un mensaje a travs de Clinical cytogeneticist.   No podemos decirle cul ser su copago por los medicamentos por adelantado ya que esto es diferente dependiendo de la cobertura de su seguro. Sin embargo, es posible que podamos encontrar un medicamento sustituto a Audiological scientist un formulario para que el seguro cubra el medicamento que se considera necesario.   Si se requiere una autorizacin previa para que su compaa de seguros [place] su medicamento, por favor permtanos de 1 a 2 das hbiles para completar [address]  Los precios de los medicamentos varan con frecuencia dependiendo del Environmental consultant de dnde se surte la receta y alguna farmacias pueden ofrecer precios ms baratos.  El  sitio web www.goodrx.com tiene cupones para medicamentos de Health and safety inspector. Los precios aqu no tienen en cuenta lo que podra costar con la ayuda del seguro (puede ser ms barato con su seguro), pero el sitio web puede darle el precio si no utiliz Tourist information centre manager.  - Puede imprimir el cupn correspondiente y llevarlo con su receta a la farmacia.  - Tambin puede pasar por nuestra oficina durante el horario de atencin regular y Education officer, museum una tarjeta de cupones de GoodRx.  - Si necesita que su receta se enve electrnicamente a una farmacia diferente, informe a nuestra oficina a travs de MyChart de [place] o por telfono llamando al [phone] y presione la opcin 4.

## 2023-01-09 ENCOUNTER — Encounter: Payer: Self-pay | Admitting: Dermatology

## 2023-01-24 ENCOUNTER — Ambulatory Visit: Payer: BC Managed Care – PPO | Admitting: Dermatology

## 2023-01-31 ENCOUNTER — Ambulatory Visit: Payer: BC Managed Care – PPO | Admitting: Dermatology

## 2023-01-31 VITALS — Wt 130.0 lb

## 2023-01-31 DIAGNOSIS — K130 Diseases of lips: Secondary | ICD-10-CM | POA: Diagnosis not present

## 2023-01-31 DIAGNOSIS — Z79899 Other long term (current) drug therapy: Secondary | ICD-10-CM

## 2023-01-31 DIAGNOSIS — L853 Xerosis cutis: Secondary | ICD-10-CM | POA: Diagnosis not present

## 2023-01-31 DIAGNOSIS — Z7189 Other specified counseling: Secondary | ICD-10-CM | POA: Diagnosis not present

## 2023-01-31 DIAGNOSIS — L700 Acne vulgaris: Secondary | ICD-10-CM

## 2023-01-31 MED ORDER — ISOTRETINOIN 30 MG PO CAPS: 30.0000 mg | ORAL_CAPSULE | Freq: Every day | ORAL | 0 refills | Status: DC

## 2023-01-31 NOTE — Progress Notes (Signed)
Isotretinoin Follow-Up Visit   Subjective  Latasha Smith is a 37 y.o. female who presents for the following: Isotretinoin follow-up  Week # 4   Isotretinoin F/U - 01/31/23 0900       Isotretinoin Follow Up   iPledge # [id number]    Date 01/31/23    Weight 130 lb (59 kg)    Two Forms of Birth Control Female Vasectomy;Female Condom    Acne breakouts since last visit? Yes      Dosage   Target Dosage (mg) 8850    Current (To Date) Dosage (mg) 600    To Go Dosage (mg) 8250      Skin Side Effects   Dry Lips Yes    Nose bleeds Yes    Dry eyes No    Dry Skin No    Sunburn No      Gastrointestinal Side Effects   Nausea No    Diarrhea No    Blood in stool No      Neurological Side Effects   Blurred vision No    Depression No    Headache No    Homicidal thoughts No    Mood Changes No    Suicidal thoughts No      Constitutional Side Effects   Fatigue No      Musculoskeletal Side Effects   Muscle aches No              Side effects: Dry skin, dry lips  Patient is not pregnant, not seeking pregnancy, and not breastfeeding.   The following portions of the chart were reviewed this encounter and updated as appropriate: medications, allergies, medical history  Review of Systems:  No other skin or systemic complaints except as noted in HPI or Assessment and Plan.  Objective  Well appearing patient in no apparent distress; mood and affect are within normal limits.  An examination of the face, neck, chest, and back was performed and relevant findings are noted below.     Assessment & Plan     ACNE VULGARIS Patient is currently on Isotretinoin requiring FDA mandated monthly evaluations and laboratory monitoring. Condition is currently not to goal (must reach target dose based on weight and also have clear skin for 2 months prior to discontinuation in order to help prevent relapse)  Exam findings: Erythematous papules of the jaw line.   Week #  4 Pharmacy [facility] # [id number] Total mg -  600 Total mg/kg - 10.2 Birth Control- Husband has a vasectomy, female latex condoms  Continue isotretinoin increasing to 30 mg po QD  Urine pregnancy test performed in office today and was negative.  Patient demonstrates comprehension and confirms she will not get pregnant.   Patient confirmed in iPledge and isotretinoin sent to pharmacy.   Isotretinoin Counseling; Review and Contraception Counseling: Reviewed potential side effects of isotretinoin including xerosis, cheilitis, hepatitis, hyperlipidemia, and severe birth defects if taken by a pregnant woman.  Women on isotretinoin must be celibate (not having sex) or required to use at least 2 birth control methods to prevent pregnancy (unless patient is a female of non-child bearing potential).  Females of child-bearing potential must have monthly pregnancy tests while on isotretinoin and report through I-Pledge (FDA monitoring program). Reviewed reports of suicidal ideation in those with a history of depression while taking isotretinoin and reports of diagnosis of inflammatory bowl disease (IBD) while taking isotretinoin as well as the lack of evidence for a causal relationship between isotretinoin, depression and  IBD. Patient advised to reach out with any questions or concerns. Patient advised not to share pills or donate blood while on treatment or for one month after completing treatment. All patient's considering Isotretinoin must read and understand and sign Isotretinoin Consent Form and be registered with I-Pledge.  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 Clayborne Artist Cortibalm recommended   Long term medication management (isotretinoin)  Patient is using long term (months to years)  prescription medication  to control their dermatologic condition.  These medications require periodic monitoring to evaluate for efficacy and side effects and may require periodic laboratory monitoring.  - While taking Isotretinoin and for 30 days after you finish the medication, do not get pregnant, do not share pills, do not donate blood. Isotretinoin is best absorbed when taken with a fatty meal. Isotretinoin can make you sensitive to the sun. Daily careful sun protection including sunscreen SPF 30+ when outdoors is recommended.  Follow-up in 30 days.  Latasha Smith, CMA, am acting as scribe for Armida Sans, MD .  Documentation: I have reviewed the above documentation for accuracy and completeness, and I agree with the above.  Armida Sans, MD

## 2023-01-31 NOTE — Patient Instructions (Signed)

## 2023-02-11 ENCOUNTER — Encounter: Payer: Self-pay | Admitting: Dermatology

## 2023-03-06 ENCOUNTER — Ambulatory Visit: Payer: 59 | Admitting: Dermatology

## 2023-03-06 ENCOUNTER — Encounter: Payer: Self-pay | Admitting: Dermatology

## 2023-03-06 VITALS — Wt 130.0 lb

## 2023-03-06 DIAGNOSIS — L853 Xerosis cutis: Secondary | ICD-10-CM

## 2023-03-06 DIAGNOSIS — Z7189 Other specified counseling: Secondary | ICD-10-CM | POA: Diagnosis not present

## 2023-03-06 DIAGNOSIS — Z79899 Other long term (current) drug therapy: Secondary | ICD-10-CM

## 2023-03-06 DIAGNOSIS — L700 Acne vulgaris: Secondary | ICD-10-CM

## 2023-03-06 DIAGNOSIS — K130 Diseases of lips: Secondary | ICD-10-CM

## 2023-03-06 MED ORDER — ISOTRETINOIN 40 MG PO CAPS: 40.0000 mg | ORAL_CAPSULE | Freq: Every day | ORAL | 0 refills | Status: AC

## 2023-03-06 NOTE — Patient Instructions (Signed)

## 2023-03-06 NOTE — Progress Notes (Signed)
Isotretinoin Follow-Up Visit   Subjective  Latasha Smith is a 38 y.o. female who presents for the following: Isotretinoin follow-up, Isotretinoin 30mg  1 po qd  Week # 8   Isotretinoin F/U - 03/06/23 1100       Isotretinoin Follow Up   iPledge # [id number]    Date 03/06/23    Weight 130 lb (59 kg)    Two Forms of Birth Control Female Vasectomy;Female Condom    Acne breakouts since last visit? Yes      Dosage   Target Dosage (mg) 8850    Current (To Date) Dosage (mg) 1500    To Go Dosage (mg) 7350      Skin Side Effects   Dry Lips Yes    Nose bleeds Yes    Dry eyes No    Dry Skin No    Sunburn No      Gastrointestinal Side Effects   Nausea No    Diarrhea No    Blood in stool No      Neurological Side Effects   Blurred vision No    Depression No    Headache No    Homicidal thoughts No    Mood Changes No    Suicidal thoughts No      Constitutional Side Effects   Fatigue No      Musculoskeletal Side Effects   Muscle aches Yes              Side effects: Dry skin, dry lips  Patient is not pregnant, not seeking pregnancy, and not breastfeeding.   The following portions of the chart were reviewed this encounter and updated as appropriate: medications, allergies, medical history  Review of Systems:  No other skin or systemic complaints except as noted in HPI or Assessment and Plan.  Objective  Well appearing patient in no apparent distress; mood and affect are within normal limits.  An examination of the face, neck, chest, and back was performed and relevant findings are noted below.     Assessment & Plan     ACNE VULGARIS Patient is currently on Isotretinoin requiring FDA mandated monthly evaluations and laboratory monitoring. Condition is currently not to goal (must reach target dose based on weight and also have clear skin for 2 months prior to discontinuation in order to help prevent relapse)  Exam findings: Few paps face  Week #  8 Pharmacy [facility] # [id number]  Total mg -  1,500mg  Total mg/kg - 25.4mg /kg Birth Control- Husband has a vasectomy, female latex condoms   Continue isotretinoin Increase to Isotretinoin 40mg  1 po qd with fatty meal  Urine pregnancy test performed in office today and was negative.  Patient demonstrates comprehension and confirms she will not get pregnant. Lot [id number] exp 01/16/2024  Patient confirmed in iPledge and isotretinoin sent to pharmacy.   Isotretinoin Counseling; Review and Contraception Counseling: Reviewed potential side effects of isotretinoin including xerosis, cheilitis, hepatitis, hyperlipidemia, and severe birth defects if taken by a pregnant woman.  Women on isotretinoin must be celibate (not having sex) or required to use at least 2 birth control methods to prevent pregnancy (unless patient is a female of non-child bearing potential).  Females of child-bearing potential must have monthly pregnancy tests while on isotretinoin and report through I-Pledge (FDA monitoring program). Reviewed reports of suicidal ideation in those with a history of depression while taking isotretinoin and reports of diagnosis of inflammatory bowl disease (IBD) while taking isotretinoin as well as the lack  of evidence for a causal relationship between isotretinoin, depression and IBD. Patient advised to reach out with any questions or concerns. Patient advised not to share pills or donate blood while on treatment or for one month after completing treatment. All patient's considering Isotretinoin must read and understand and sign Isotretinoin Consent Form and be registered with I-Pledge.  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 Clayborne Artist Cortibalm recommended   Long term medication  management (isotretinoin)  Patient is using long term (months to years) prescription medication  to control their dermatologic condition.  These medications require periodic monitoring to evaluate for efficacy and side effects and may require periodic laboratory monitoring.  - While taking Isotretinoin and for 30 days after you finish the medication, do not get pregnant, do not share pills, do not donate blood. Isotretinoin is best absorbed when taken with a fatty meal. Isotretinoin can make you sensitive to the sun. Daily careful sun protection including sunscreen SPF 30+ when outdoors is recommended.  Follow-up in 30 days.  I, Ardis Rowan, RMA, am acting as scribe for Armida Sans, MD .   Documentation: I have reviewed the above documentation for accuracy and completeness, and I agree with the above.  Armida Sans, MD

## 2023-03-07 ENCOUNTER — Ambulatory Visit: Payer: BC Managed Care – PPO | Admitting: Dermatology

## 2023-03-08 ENCOUNTER — Other Ambulatory Visit: Payer: Self-pay | Admitting: Family Medicine

## 2023-03-08 ENCOUNTER — Ambulatory Visit
Admission: RE | Admit: 2023-03-08 | Discharge: 2023-03-08 | Disposition: A | Discharge status: Home / Self Care | Payer: 59 | Source: Ambulatory Visit | Attending: Family Medicine | Admitting: Family Medicine

## 2023-03-08 DIAGNOSIS — N6321 Unspecified lump in the left breast, upper outer quadrant: Secondary | ICD-10-CM | POA: Diagnosis present

## 2023-03-08 DIAGNOSIS — N644 Mastodynia: Secondary | ICD-10-CM

## 2023-04-08 ENCOUNTER — Other Ambulatory Visit: Payer: Self-pay | Admitting: Family Medicine

## 2023-04-09 ENCOUNTER — Other Ambulatory Visit: Payer: Self-pay

## 2023-04-09 MED ORDER — LISDEXAMFETAMINE DIMESYLATE 40 MG PO CAPS
40.0000 mg | ORAL_CAPSULE | Freq: Every morning | ORAL | 0 refills | Status: AC
  Filled 2023-04-09: qty 30, 30d supply, fill #0

## 2023-04-11 ENCOUNTER — Encounter: Payer: Self-pay | Admitting: Dermatology

## 2023-04-11 ENCOUNTER — Ambulatory Visit: Payer: 59 | Admitting: Dermatology

## 2023-04-11 VITALS — Wt 130.0 lb

## 2023-04-11 DIAGNOSIS — L853 Xerosis cutis: Secondary | ICD-10-CM | POA: Diagnosis not present

## 2023-04-11 DIAGNOSIS — L700 Acne vulgaris: Secondary | ICD-10-CM

## 2023-04-11 DIAGNOSIS — K130 Diseases of lips: Secondary | ICD-10-CM

## 2023-04-11 DIAGNOSIS — Z7189 Other specified counseling: Secondary | ICD-10-CM

## 2023-04-11 DIAGNOSIS — Z79899 Other long term (current) drug therapy: Secondary | ICD-10-CM

## 2023-04-11 NOTE — Progress Notes (Unsigned)
 Isotretinoin Follow-Up Visit   Subjective  Latasha Smith is a 38 y.o. female who presents for the following: Isotretinoin follow-up  Week # 12    Isotretinoin F/U - 04/11/23 1400       Isotretinoin Follow Up   iPledge # [id number]    Date 04/11/23    Weight 130 lb (59 kg)    Two Forms of Birth Control Female Vasectomy;Female Condom    Acne breakouts since last visit? Yes      Dosage   Target Dosage (mg) --   none the last 2 weeks     Skin Side Effects   Dry Lips Yes    Nose bleeds No    Dry eyes No    Dry Skin No    Sunburn No      Gastrointestinal Side Effects   Nausea No    Diarrhea No    Blood in stool No      Neurological Side Effects   Blurred vision No    Depression No    Headache No    Homicidal thoughts No    Mood Changes No    Suicidal thoughts No      Constitutional Side Effects   Fatigue No      Musculoskeletal Side Effects   Muscle aches No              Side effects: Dry skin, dry lips  Patient is not pregnant, not seeking pregnancy, and not breastfeeding.   The following portions of the chart were reviewed this encounter and updated as appropriate: medications, allergies, medical history  Review of Systems:  No other skin or systemic complaints except as noted in HPI or Assessment and Plan.  Objective  Well appearing patient in no apparent distress; mood and affect are within normal limits.  An examination of the face, neck, chest, and back was performed and relevant findings are noted below.     Assessment & Plan   ACNE VULGARIS   Related Medications ISOtretinoin (ACCUTANE) 30 MG capsule Take 2 capsules (60 mg total) by mouth daily. COUNSELING AND COORDINATION OF CARE   Related Medications ISOtretinoin (ACCUTANE) 30 MG capsule Take 2 capsules (60 mg total) by mouth daily. LONG-TERM USE OF HIGH-RISK MEDICATION   Related Medications ISOtretinoin (ACCUTANE) 30 MG capsule Take 2 capsules (60 mg total) by mouth  daily.  ACNE VULGARIS Patient is currently on Isotretinoin requiring FDA mandated monthly evaluations and laboratory monitoring. Condition is currently not to goal (must reach target dose based on weight and also have clear skin for 2 months prior to discontinuation in order to help prevent relapse)  Exam findings: Exam limited by makeup. No acne appreciated on face  Week # 12 Pharmacy [facility] # [id number]  Total mg -  2,700mg  Total mg/kg - 45.76mg /kg Birth Control- Husband has a vasectomy, female latex condoms  Continue isotretinoin increasing to 60 mg every day with fatty meal   Urine pregnancy test performed in office today and was negative.  Patient demonstrates comprehension and confirms she will not get pregnant.   Patient confirmed in iPledge and isotretinoin sent to pharmacy.   Isotretinoin Counseling; Review and Contraception Counseling: Reviewed potential side effects of isotretinoin including xerosis, cheilitis, hepatitis, hyperlipidemia, and severe birth defects if taken by a pregnant woman.  Women on isotretinoin must be celibate (not having sex) or required to use at least 2 birth control methods to prevent pregnancy (unless patient is a female of non-child bearing potential).  Females of child-bearing potential must have monthly pregnancy tests while on isotretinoin and report through I-Pledge (FDA monitoring program). Reviewed reports of suicidal ideation in those with a history of depression while taking isotretinoin and reports of diagnosis of inflammatory bowl disease (IBD) while taking isotretinoin as well as the lack of evidence for a causal relationship between isotretinoin, depression and IBD. Patient advised to reach out with any questions or concerns. Patient advised not to share pills or donate blood while on treatment or for one month after completing treatment. All patient's considering Isotretinoin must read and understand and sign Isotretinoin Consent  Form and be registered with I-Pledge.  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 Clayborne Artist Cortibalm recommended   Long term medication management (isotretinoin).  Patient is using long term (months to years) prescription medication  to control their dermatologic condition.  These medications require periodic monitoring to evaluate for efficacy and side effects and may require periodic laboratory monitoring.  - While taking Isotretinoin and for 30 days after you finish the medication, do not get pregnant, do not share pills, do not donate blood. Isotretinoin is best absorbed when taken with a fatty meal. Isotretinoin can make you sensitive to the sun. Daily careful sun protection including sunscreen SPF 30+ when outdoors is recommended.  Follow-up in 30 days.  [name], RMA, am acting as scribe for Elie Goody, MD .   Documentation: I have reviewed the above documentation for accuracy and completeness, and I agree with the above.  Elie Goody, MD

## 2023-04-11 NOTE — Patient Instructions (Signed)

## 2023-04-12 MED ORDER — ISOTRETINOIN 30 MG PO CAPS: 60.0000 mg | ORAL_CAPSULE | Freq: Every day | ORAL | 0 refills | Status: DC

## 2023-05-14 ENCOUNTER — Encounter: Payer: Self-pay | Admitting: Dermatology

## 2023-05-14 ENCOUNTER — Ambulatory Visit: Payer: 59 | Admitting: Dermatology

## 2023-05-14 DIAGNOSIS — Z7189 Other specified counseling: Secondary | ICD-10-CM | POA: Diagnosis not present

## 2023-05-14 DIAGNOSIS — Z79899 Other long term (current) drug therapy: Secondary | ICD-10-CM

## 2023-05-14 DIAGNOSIS — L853 Xerosis cutis: Secondary | ICD-10-CM | POA: Diagnosis not present

## 2023-05-14 DIAGNOSIS — K130 Diseases of lips: Secondary | ICD-10-CM

## 2023-05-14 DIAGNOSIS — L700 Acne vulgaris: Secondary | ICD-10-CM

## 2023-05-14 MED ORDER — ISOTRETINOIN 30 MG PO CAPS: 60.0000 mg | ORAL_CAPSULE | Freq: Every day | ORAL | 0 refills | Status: DC

## 2023-05-14 NOTE — Progress Notes (Signed)
 Isotretinoin Follow-Up Visit   Subjective  Latasha Smith is a 38 y.o. female who presents for the following: Isotretinoin follow-up. Taking 60 mg daily. Has had a few breakouts since last visit.   Week # 16   Isotretinoin F/U - 05/14/23 0800       Isotretinoin Follow Up   iPledge # [id number]    Date 05/14/23    Weight 130 lb (59 kg)    Two Forms of Birth Control Female Vasectomy;Female Condom    Acne breakouts since last visit? Yes      Dosage   Target Dosage (mg) 8850    Current (To Date) Dosage (mg) 4500    To Go Dosage (mg) 4350      Skin Side Effects   Dry Lips Yes    Nose bleeds No    Dry eyes No    Dry Skin No    Sunburn No      Gastrointestinal Side Effects   Nausea No    Diarrhea No    Blood in stool No      Neurological Side Effects   Blurred vision No    Depression No    Headache No    Homicidal thoughts No    Mood Changes No    Suicidal thoughts No      Constitutional Side Effects   Fatigue No      Musculoskeletal Side Effects   Muscle aches Yes              Side effects: Dry skin, dry lips  Patient is not pregnant, not seeking pregnancy, and not breastfeeding.   The following portions of the chart were reviewed this encounter and updated as appropriate: medications, allergies, medical history  Review of Systems:  No other skin or systemic complaints except as noted in HPI or Assessment and Plan.  Objective  Well appearing patient in no apparent distress; mood and affect are within normal limits.  An examination of the face, neck, chest, and back was performed and relevant findings are noted below.     Assessment & Plan   ACNE VULGARIS   COUNSELING AND COORDINATION OF CARE   LONG-TERM USE OF HIGH-RISK MEDICATION    ACNE VULGARIS Patient is currently on Isotretinoin requiring FDA mandated monthly evaluations and laboratory monitoring. Condition is currently not to goal (must reach target dose based on weight and  also have clear skin for 2 months prior to discontinuation in order to help prevent relapse)  Exam findings: Limited by makeup. No active lesions  Week # 16 Pharmacy [facility] #  [id number]  Total mg -  4500 mg Total mg/kg - 76.27 mg/kg Birth Control- Husband has a vasectomy, female latex condoms   Continue isotretinoin 60 mg daily  Urine pregnancy test performed in office today and was negative.  Patient demonstrates comprehension and confirms she will not get pregnant.   Patient confirmed in iPledge and isotretinoin sent to pharmacy.   Isotretinoin Counseling; Review and Contraception Counseling: Reviewed potential side effects of isotretinoin including xerosis, cheilitis, hepatitis, hyperlipidemia, and severe birth defects if taken by a pregnant woman.  Women on isotretinoin must be celibate (not having sex) or required to use at least 2 birth control methods to prevent pregnancy (unless patient is a female of non-child bearing potential).  Females of child-bearing potential must have monthly pregnancy tests while on isotretinoin and report through I-Pledge (FDA monitoring program). Reviewed reports of suicidal ideation in those with a history of  depression while taking isotretinoin and reports of diagnosis of inflammatory bowl disease (IBD) while taking isotretinoin as well as the lack of evidence for a causal relationship between isotretinoin, depression and IBD. Patient advised to reach out with any questions or concerns. Patient advised not to share pills or donate blood while on treatment or for one month after completing treatment. All patient's considering Isotretinoin must read and understand and sign Isotretinoin Consent Form and be registered with I-Pledge.  Xerosis secondary to isotretinoin therapy - Continue emollients as directed - Xyzal (levocetirizine) once a day and fish oil 1 gram daily may also help with dryness   Cheilitis secondary to isotretinoin therapy -  Continue lip balm as directed, Dr. Clayborne Artist Cortibalm recommended   Long term medication management (isotretinoin)  Patient is using long term (months to years) prescription medication  to control their dermatologic condition.  These medications require periodic monitoring to evaluate for efficacy and side effects and may require periodic laboratory monitoring.  - While taking Isotretinoin and for 30 days after you finish the medication, do not get pregnant, do not share pills, do not donate blood. Isotretinoin is best absorbed when taken with a fatty meal. Isotretinoin can make you sensitive to the sun. Daily careful sun protection including sunscreen SPF 30+ when outdoors is recommended.  Follow-up in 30 days.  I, Lawson Radar, CMA, am acting as scribe for Elie Goody, MD.   Documentation: I have reviewed the above documentation for accuracy and completeness, and I agree with the above.  Elie Goody, MD

## 2023-05-14 NOTE — Patient Instructions (Signed)
 Continue Isotretinoin 60 mg daily.     Isotretinoin Counseling; Review and Contraception Counseling: Reviewed potential side effects of isotretinoin including xerosis, cheilitis, hepatitis, hyperlipidemia, and severe birth defects if taken by a pregnant woman.  Women on isotretinoin must be celibate (not having sex) or required to use at least 2 birth control methods to prevent pregnancy (unless patient is a female of non-child bearing potential).  Females of child-bearing potential must have monthly pregnancy tests while on isotretinoin and report through I-Pledge (FDA monitoring program). Reviewed reports of suicidal ideation in those with a history of depression while taking isotretinoin and reports of diagnosis of inflammatory bowl disease (IBD) while taking isotretinoin as well as the lack of evidence for a causal relationship between isotretinoin, depression and IBD. Patient advised to reach out with any questions or concerns. Patient advised not to share pills or donate blood while on treatment or for one month after completing treatment.     Recommend daily broad spectrum sunscreen SPF 30+ to sun-exposed areas, reapply every 2 hours as needed. Call for new or changing lesions.  Staying in the shade or wearing long sleeves, sun glasses (UVA+UVB protection) and wide brim hats (4-inch brim around the entire circumference of the hat) are also recommended for sun protection.     Due to recent changes in healthcare laws, you may see results of your pathology and/or laboratory studies on MyChart before the doctors have had a chance to review them. We understand that in some cases there may be results that are confusing or concerning to you. Please understand that not all results are received at the same time and often the doctors may need to interpret multiple results in order to provide you with the best plan of care or course of treatment. Therefore, we ask that you please give [place] 2 business days  to thoroughly review all your results before contacting the office for clarification. Should we see a critical lab result, you will be contacted sooner.   If You Need Anything After Your Visit  If you have any questions or concerns for your doctor, please call our main line at [phone] and press option 4 to reach your doctor's medical assistant. If no one answers, please leave a voicemail as directed and we will return your call as soon as possible. Messages left after 4 pm will be answered the following business day.   You may also send [place] a message via MyChart. We typically respond to MyChart messages within 1-2 business days.  For prescription refills, please ask your pharmacy to contact our office. Our fax number is [phone].  If you have an urgent issue when the clinic is closed that cannot wait until the next business day, you can page your doctor at the number below.    Please note that while we do our best to be available for urgent issues outside of office hours, we are not available 24/7.   If you have an urgent issue and are unable to reach [place], you may choose to seek medical care at your doctor's office, retail clinic, urgent care center, or emergency room.  If you have a medical emergency, please immediately call 911 or go to the emergency department.  Pager Numbers  - Dr. Gwen Pounds: [phone]  - Dr. Roseanne Reno: [phone]  - Dr. Katrinka Blazing: [phone]   In the event of inclement weather, please call our main line at [phone] for an update on the status of any delays or closures.  Dermatology Medication  Tips: Please keep the boxes that topical medications come in in order to help keep track of the instructions about where and how to use these. Pharmacies typically print the medication instructions only on the boxes and not directly on the medication tubes.   If your medication is too expensive, please contact our office at [phone] option 4 or send [place] a  message through MyChart.   We are unable to tell what your co-pay for medications will be in advance as this is different depending on your insurance coverage. However, we may be able to find a substitute medication at lower cost or fill out paperwork to get insurance to cover a needed medication.   If a prior authorization is required to get your medication covered by your insurance company, please allow [place] 1-2 business days to complete this process.  Drug prices often vary depending on where the prescription is filled and some pharmacies may offer cheaper prices.  The website www.goodrx.com contains coupons for medications through different pharmacies. The prices here do not account for what the cost may be with help from insurance (it may be cheaper with your insurance), but the website can give you the price if you did not use any insurance.  - You can print the associated coupon and take it with your prescription to the pharmacy.  - You may also stop by our office during regular business hours and pick up a GoodRx coupon card.  - If you need your prescription sent electronically to a different pharmacy, notify our office through [facility] or by phone at [phone] option 4.     Si Usted Necesita Algo Despus de Su Visita  Tambin puede enviarnos un mensaje a travs de Clinical cytogeneticist. Por lo general respondemos a los mensajes de MyChart en el transcurso de 1 a 2 das hbiles.  Para renovar recetas, por favor pida a su farmacia que se ponga en contacto con nuestra oficina. Annie Sable de fax es [place] [phone].  Si tiene un asunto urgente cuando la clnica est cerrada y que no puede esperar hasta el siguiente da hbil, puede llamar/localizar a su doctor(a) al nmero que aparece a continuacin.   Por favor, tenga en cuenta que aunque hacemos todo lo posible para estar disponibles para asuntos urgentes fuera del horario de [place], no estamos disponibles las 24 horas del da, los 7  [address]  [address] de la [place].   Si tiene un problema urgente y no puede comunicarse con nosotros, puede optar por buscar atencin mdica  en el consultorio de su doctor(a), en una clnica privada, en un centro de atencin urgente o en una sala de emergencias.  Si tiene Engineer, drilling, por favor llame inmediatamente al 911 o vaya a la sala de emergencias.  Nmeros de bper  - Dr. Gwen Pounds: [phone]  - Dra. Roseanne Reno: [phone]  - Dr. Katrinka Blazing: [phone]   En caso de inclemencias del tiempo, por favor llame a Lacy Duverney principal al [phone] para una actualizacin sobre el [place] de cualquier retraso o cierre.  Consejos para la medicacin en dermatologa: Por favor, guarde las cajas en las que vienen los medicamentos de uso tpico para ayudarle a seguir las instrucciones sobre dnde y cmo usarlos. Las farmacias generalmente imprimen las instrucciones del medicamento slo en las cajas y no directamente en los tubos del [place].   Si su medicamento es muy caro, por favor, pngase en contacto con Rolm Gala llamando al [phone] y presione la opcin 4 o envenos un mensaje  a travs de MyChart.   No podemos decirle cul ser su copago por los medicamentos por adelantado ya que esto es diferente dependiendo de la cobertura de su seguro. Sin embargo, es posible que podamos encontrar un medicamento sustituto a Audiological scientist un formulario para que el seguro cubra el medicamento que se considera necesario.   Si se requiere una autorizacin previa para que su compaa de seguros [place] su medicamento, por favor permtanos de 1 a 2 das hbiles para completar [address]  Los precios de los medicamentos varan con frecuencia dependiendo del Environmental consultant de dnde se surte la receta y alguna farmacias pueden ofrecer precios ms baratos.  El sitio web www.goodrx.com tiene cupones para medicamentos de Health and safety inspector. Los precios aqu no tienen en cuenta lo que podra costar con  la ayuda del seguro (puede ser ms barato con su seguro), pero el sitio web puede darle el precio si no utiliz Tourist information centre manager.  - Puede imprimir el cupn correspondiente y llevarlo con su receta a la farmacia.  - Tambin puede pasar por nuestra oficina durante el horario de atencin regular y Education officer, museum una tarjeta de cupones de GoodRx.  - Si necesita que su receta se enve electrnicamente a una farmacia diferente, informe a nuestra oficina a travs de MyChart de [place] o por telfono llamando al [phone] y presione la opcin 4.

## 2023-06-14 ENCOUNTER — Encounter: Payer: Self-pay | Admitting: Dermatology

## 2023-06-14 ENCOUNTER — Ambulatory Visit: Admitting: Dermatology

## 2023-06-14 VITALS — Wt 130.0 lb

## 2023-06-14 DIAGNOSIS — Z79899 Other long term (current) drug therapy: Secondary | ICD-10-CM

## 2023-06-14 DIAGNOSIS — K130 Diseases of lips: Secondary | ICD-10-CM | POA: Diagnosis not present

## 2023-06-14 DIAGNOSIS — L853 Xerosis cutis: Secondary | ICD-10-CM | POA: Diagnosis not present

## 2023-06-14 DIAGNOSIS — L700 Acne vulgaris: Secondary | ICD-10-CM

## 2023-06-14 DIAGNOSIS — Z7189 Other specified counseling: Secondary | ICD-10-CM | POA: Diagnosis not present

## 2023-06-14 MED ORDER — ISOTRETINOIN 30 MG PO CAPS
60.0000 mg | ORAL_CAPSULE | Freq: Every day | ORAL | 0 refills | Status: AC
Start: 2023-06-14 — End: ?

## 2023-06-14 NOTE — Progress Notes (Signed)
 Isotretinoin  Follow-Up Visit   Subjective  Latasha Smith is a 38 y.o. female who presents for the following: Isotretinoin  follow-up. Taking 60 mg daily.   Week # 20   Isotretinoin  F/U - 06/14/23 0900       Isotretinoin  Follow Up   iPledge # [id number]    Date 06/14/23    Weight 130 lb (59 kg)    Two Forms of Birth Control Female Vasectomy;Female Condom    Acne breakouts since last visit? No      Dosage   Target Dosage (mg) 8850    Current (To Date) Dosage (mg) 6300    To Go Dosage (mg) 2550      Skin Side Effects   Dry Lips Yes    Nose bleeds No    Dry eyes Yes    Dry Skin Yes    Sunburn No      Gastrointestinal Side Effects   Nausea No    Diarrhea No    Blood in stool No      Neurological Side Effects   Blurred vision No    Depression No    Headache No    Homicidal thoughts No    Mood Changes No    Suicidal thoughts No      Constitutional Side Effects   Fatigue No      Musculoskeletal Side Effects   Muscle aches No      Labs Notes   Last labs done 04/12/23             Side effects: Dry skin, dry lips  Patient is not pregnant, not seeking pregnancy, and not breastfeeding.   The following portions of the chart were reviewed this encounter and updated as appropriate: medications, allergies, medical history  Review of Systems:  No other skin or systemic complaints except as noted in HPI or Assessment and Plan.  Objective  Well appearing patient in no apparent distress; mood and affect are within normal limits.  An examination of the face, neck, chest, and back was performed and relevant findings are noted below.     Assessment & Plan   ACNE VULGARIS   COUNSELING AND COORDINATION OF CARE   LONG-TERM USE OF HIGH-RISK MEDICATION    ACNE VULGARIS Patient is currently on Isotretinoin  requiring FDA mandated monthly evaluations and laboratory monitoring. Condition is currently not to goal (must reach target dose based on weight and  also have clear skin for 2 months prior to discontinuation in order to help prevent relapse)  Exam findings: No active lesions today. Exam limited by makeup  Week # 20 Pharmacy [facility] # [id number]  Total mg -  6300 mg Total mg/kg - 106.78 mg/kg Birth Control- Husband has a vasectomy, female latex condoms   Continue isotretinoin  60 mg daily. Do not recommend increasing daily dose. 2 more months of treatment for standard goal and 4 months for higher goal with lower risk of recurrence  Urine pregnancy test performed in office today and was negative.  Patient demonstrates comprehension and confirms she will not get pregnant.  Lot: [id number] Exp: 08/12/2024  Patient confirmed in iPledge and isotretinoin  sent to pharmacy.   **Patient will be changing jobs soon. Concerned there could be a lapse in insurance coverage. If unable to return next month she will send in photo of OTC UPT, with wording not lines. Will confirm and send in Rx**  Isotretinoin  Counseling; Review and Contraception Counseling: Reviewed potential side effects of isotretinoin  including xerosis, cheilitis, hepatitis,  hyperlipidemia, and severe birth defects if taken by a pregnant woman.  Women on isotretinoin  must be celibate (not having sex) or required to use at least 2 birth control methods to prevent pregnancy (unless patient is a female of non-child bearing potential).  Females of child-bearing potential must have monthly pregnancy tests while on isotretinoin  and report through I-Pledge (FDA monitoring program). Reviewed reports of suicidal ideation in those with a history of depression while taking isotretinoin  and reports of diagnosis of inflammatory bowl disease (IBD) while taking isotretinoin  as well as the lack of evidence for a causal relationship between isotretinoin , depression and IBD. Patient advised to reach out with any questions or concerns. Patient advised not to share pills or donate blood while on  treatment or for one month after completing treatment. All patient's considering Isotretinoin  must read and understand and sign Isotretinoin  Consent Form and be registered with I-Pledge.  Xerosis secondary to isotretinoin  therapy - Continue emollients as directed - Xyzal (levocetirizine) once a day and fish oil 1 gram daily may also help with dryness   Cheilitis secondary to isotretinoin  therapy - Continue lip balm as directed, Dr. Suzann Ernst Cortibalm recommended   Long term medication management (isotretinoin )  Patient is using long term (months to years) prescription medication  to control their dermatologic condition.  These medications require periodic monitoring to evaluate for efficacy and side effects and may require periodic laboratory monitoring.  - While taking Isotretinoin  and for 30 days after you finish the medication, do not get pregnant, do not share pills, do not donate blood. Isotretinoin  is best absorbed when taken with a fatty meal. Isotretinoin  can make you sensitive to the sun. Daily careful sun protection including sunscreen SPF 30+ when outdoors is recommended.  Follow-up in 30 days.  I, Jill Parcell, CMA, am acting as scribe for Harris Liming, MD.   Documentation: I have reviewed the above documentation for accuracy and completeness, and I agree with the above.  Harris Liming, MD

## 2023-06-14 NOTE — Patient Instructions (Addendum)
 Continue isotretinoin  60 mg daily    Reviewed potential side effects of isotretinoin  including xerosis, cheilitis, hepatitis, hyperlipidemia, and severe birth defects if taken by a pregnant woman.  Women on isotretinoin  must be celibate (not having sex) or required to use at least 2 birth control methods to prevent pregnancy (unless patient is a female of non-child bearing potential).  Females of child-bearing potential must have monthly pregnancy tests while on isotretinoin  and report through [employer]-Pledge (FDA monitoring program). Reviewed reports of suicidal ideation in those with a history of depression while taking isotretinoin  and reports of diagnosis of inflammatory bowl disease (IBD) while taking isotretinoin  as well as the lack of evidence for a causal relationship between isotretinoin , depression and IBD. Patient advised to reach out with any questions or concerns. Patient advised not to share pills or donate blood while on treatment or for one month after completing treatment.     Due to recent changes in healthcare laws, you may see results of your pathology and/or laboratory studies on MyChart before the doctors have had a chance to review them. We understand that in some cases there may be results that are confusing or concerning to you. Please understand that not all results are received at the same time and often the doctors may need to interpret multiple results in order to provide you with the best plan of care or course of treatment. Therefore, we ask that you please give us  2 business days to thoroughly review all your results before contacting the office for clarification. Should we see a critical lab result, you will be contacted sooner.   If You Need Anything After Your Visit  If you have any questions or concerns for your doctor, please call our main line at [phone] and press option 4 to reach your doctor's medical assistant. If no one answers, please leave a voicemail as  directed and we will return your call as soon as possible. Messages left after 4 pm will be answered the following business day.   You may also send us  a message via MyChart. We typically respond to MyChart messages within 1-2 business days.  For prescription refills, please ask your pharmacy to contact our office. Our fax number is [phone].  If you have an urgent issue when the clinic is closed that cannot wait until the next business day, you can page your doctor at the number below.    Please note that while we do our best to be available for urgent issues outside of office hours, we are not available 24/7.   If you have an urgent issue and are unable to reach us , you may choose to seek medical care at your doctor's office, retail clinic, urgent care center, or emergency room.  If you have a medical emergency, please immediately call 911 or go to the emergency department.  Pager Numbers  - Dr. Bary Likes: [phone]  - Dr. Annette Barters: [phone]  - Dr. Felipe Horton: [phone]   In the event of inclement weather, please call our main line at [phone] for an update on the status of any delays or closures.  Dermatology Medication Tips: Please keep the boxes that topical medications come in in order to help keep track of the instructions about where and how to use these. Pharmacies typically print the medication instructions only on the boxes and not directly on the medication tubes.   If your medication is too expensive, please contact our office at [phone] option 4 or send us  a message through  MyChart.   We are unable to tell what your co-pay for medications will be in advance as this is different depending on your insurance coverage. However, we may be able to find a substitute medication at lower cost or fill out paperwork to get insurance to cover a needed medication.   If a prior authorization is required to get your medication covered by your insurance company, please  allow us  1-2 business days to complete this process.  Drug prices often vary depending on where the prescription is filled and some pharmacies may offer cheaper prices.  The website www.goodrx.com contains coupons for medications through different pharmacies. The prices here do not account for what the cost may be with help from insurance (it may be cheaper with your insurance), but the website can give you the price if you did not use any insurance.  - You can print the associated coupon and take it with your prescription to the pharmacy.  - You may also stop by our office during regular business hours and pick up a GoodRx coupon card.  - If you need your prescription sent electronically to a different pharmacy, notify our office through [facility] or by phone at [phone] option 4.     Si Usted Necesita Algo Despus de Su Visita  Tambin puede enviarnos un mensaje a travs de Clinical cytogeneticist. Por lo general respondemos a los mensajes de MyChart en el transcurso de 1 a 2 das hbiles.  Para renovar recetas, por favor pida a su farmacia que se ponga en contacto con nuestra oficina. Franz Jacks de fax es [place] [phone].  Si tiene un asunto urgente cuando la clnica est cerrada y que no puede esperar hasta el siguiente da hbil, puede llamar/localizar a su doctor(a) al nmero que aparece a continuacin.   Por favor, tenga en cuenta que aunque hacemos todo lo posible para estar disponibles para asuntos urgentes fuera del horario de [place], no estamos disponibles las 24 horas del da, los 7 [address]  [address] de la [place].   Si tiene un problema urgente y no puede comunicarse con nosotros, puede optar por buscar atencin mdica  en el consultorio de su doctor(a), en una clnica privada, en un centro de atencin urgente o en una sala de emergencias.  Si tiene Engineer, drilling, por favor llame inmediatamente al 911 o vaya a la sala de emergencias.  Nmeros de bper  - Dr. Bary Likes:  [phone]  - Dra. Annette Barters: [phone]  - Dr. Felipe Horton: [phone]   En caso de inclemencias del tiempo, por favor llame a Lajuan Pila principal al [phone] para una actualizacin sobre el [place] de cualquier retraso o cierre.  Consejos para la medicacin en dermatologa: Por favor, guarde las cajas en las que vienen los medicamentos de uso tpico para ayudarle a seguir las instrucciones sobre dnde y cmo usarlos. Las farmacias generalmente imprimen las instrucciones del medicamento slo en las cajas y no directamente en los tubos del [place].   Si su medicamento es muy caro, por favor, pngase en contacto con Bettyjane Brunet llamando al [phone] y presione la opcin 4 o envenos un mensaje a travs de Clinical cytogeneticist.   No podemos decirle cul ser su copago por los medicamentos por adelantado ya que esto es diferente dependiendo de la cobertura de su seguro. Sin embargo, es posible que podamos encontrar un medicamento sustituto a Audiological scientist un formulario para que el seguro cubra el medicamento que se considera necesario.   Si se requiere una autorizacin  previa para que su compaa de seguros [place] su medicamento, por favor permtanos de 1 a 2 das hbiles para completar este proceso.  Los precios de los medicamentos varan con frecuencia dependiendo del Environmental consultant de dnde se surte la receta y alguna farmacias pueden ofrecer precios ms baratos.  El sitio web www.goodrx.com tiene cupones para medicamentos de Health and safety inspector. Los precios aqu no tienen en cuenta lo que podra costar con la ayuda del seguro (puede ser ms barato con su seguro), pero el sitio web puede darle el precio si no utiliz Tourist information centre manager.  - Puede imprimir el cupn correspondiente y llevarlo con su receta a la farmacia.  - Tambin puede pasar por nuestra oficina durante el horario de atencin regular y Education officer, museum una tarjeta de cupones de GoodRx.  - Si necesita que su receta se enve electrnicamente a  una farmacia diferente, informe a nuestra oficina a travs de MyChart de [place] o por telfono llamando al [phone] y presione la opcin 4.

## 2023-07-22 ENCOUNTER — Encounter: Payer: Self-pay | Admitting: Dermatology

## 2023-07-23 ENCOUNTER — Ambulatory Visit: Admitting: Dermatology

## 2023-07-30 ENCOUNTER — Other Ambulatory Visit: Payer: Self-pay | Admitting: Dermatology

## 2023-07-30 DIAGNOSIS — Z79899 Other long term (current) drug therapy: Secondary | ICD-10-CM

## 2023-07-30 DIAGNOSIS — L700 Acne vulgaris: Secondary | ICD-10-CM

## 2023-07-30 MED ORDER — ISOTRETINOIN 30 MG PO CAPS: 60.0000 mg | ORAL_CAPSULE | Freq: Every day | ORAL | 0 refills | Status: AC

## 2023-08-06 ENCOUNTER — Other Ambulatory Visit: Payer: Self-pay | Admitting: Dermatology

## 2023-08-06 DIAGNOSIS — H5710 Ocular pain, unspecified eye: Secondary | ICD-10-CM

## 2023-08-06 NOTE — Addendum Note (Signed)
 Addended by: CLAUDENE LEHMANN on: 08/06/2023 09:43 AM   Modules accepted: Orders

## 2023-11-13 ENCOUNTER — Telehealth: Payer: Self-pay

## 2023-11-13 NOTE — Telephone Encounter (Signed)
 Dr. Arlyss King office sent fax stating patient did not respond to referral. Fax placed in media. aw
